# Patient Record
Sex: Female | Born: 1960 | Race: White | Hispanic: No | Marital: Married | Smoking: Current every day smoker
Health system: Southern US, Community
[De-identification: ages and names within clinical notes are randomized; demographics above are authoritative.]

## PROBLEM LIST (undated history)

## (undated) DIAGNOSIS — D649 Anemia, unspecified: Secondary | ICD-10-CM

## (undated) DIAGNOSIS — T7840XA Allergy, unspecified, initial encounter: Secondary | ICD-10-CM

## (undated) DIAGNOSIS — R809 Proteinuria, unspecified: Secondary | ICD-10-CM

## (undated) DIAGNOSIS — Z860100 Personal history of colon polyps, unspecified: Secondary | ICD-10-CM

## (undated) DIAGNOSIS — M810 Age-related osteoporosis without current pathological fracture: Secondary | ICD-10-CM

## (undated) DIAGNOSIS — I10 Essential (primary) hypertension: Secondary | ICD-10-CM

## (undated) DIAGNOSIS — F419 Anxiety disorder, unspecified: Secondary | ICD-10-CM

## (undated) HISTORY — DX: Essential (primary) hypertension: I10

## (undated) HISTORY — DX: Personal history of colon polyps, unspecified: Z86.0100

## (undated) HISTORY — PX: POLYPECTOMY: SHX149

## (undated) HISTORY — DX: Anxiety disorder, unspecified: F41.9

## (undated) HISTORY — DX: Age-related osteoporosis without current pathological fracture: M81.0

## (undated) HISTORY — DX: Proteinuria, unspecified: R80.9

## (undated) HISTORY — PX: COLONOSCOPY W/ POLYPECTOMY: SHX1380

## (undated) HISTORY — DX: Allergy, unspecified, initial encounter: T78.40XA

## (undated) HISTORY — PX: COLONOSCOPY: SHX174

## (undated) HISTORY — DX: Anemia, unspecified: D64.9

---

## 2000-11-04 ENCOUNTER — Inpatient Hospital Stay (HOSPITAL_COMMUNITY): Admission: EM | Admit: 2000-11-04 | Discharge: 2000-11-09 | Payer: Self-pay | Admitting: *Deleted

## 2000-11-10 ENCOUNTER — Other Ambulatory Visit (HOSPITAL_COMMUNITY): Admission: RE | Admit: 2000-11-10 | Discharge: 2000-11-10 | Payer: Self-pay | Admitting: Psychiatry

## 2003-12-25 ENCOUNTER — Ambulatory Visit: Payer: Self-pay | Admitting: Family Medicine

## 2004-02-18 ENCOUNTER — Ambulatory Visit: Payer: Self-pay | Admitting: Family Medicine

## 2004-03-18 ENCOUNTER — Ambulatory Visit: Payer: Self-pay | Admitting: Family Medicine

## 2004-05-07 ENCOUNTER — Ambulatory Visit: Payer: Self-pay | Admitting: Family Medicine

## 2004-06-08 ENCOUNTER — Ambulatory Visit: Payer: Self-pay | Admitting: Family Medicine

## 2004-08-11 ENCOUNTER — Ambulatory Visit: Payer: Self-pay | Admitting: Family Medicine

## 2004-10-06 ENCOUNTER — Ambulatory Visit: Payer: Self-pay | Admitting: Family Medicine

## 2004-12-17 ENCOUNTER — Ambulatory Visit: Payer: Self-pay | Admitting: Family Medicine

## 2005-04-05 ENCOUNTER — Ambulatory Visit: Payer: Self-pay | Admitting: Family Medicine

## 2006-03-15 ENCOUNTER — Ambulatory Visit: Payer: Self-pay | Admitting: Family Medicine

## 2006-04-01 ENCOUNTER — Ambulatory Visit: Payer: Self-pay | Admitting: Family Medicine

## 2007-06-09 ENCOUNTER — Ambulatory Visit (HOSPITAL_COMMUNITY): Admission: RE | Admit: 2007-06-09 | Discharge: 2007-06-09 | Payer: Self-pay | Admitting: Family Medicine

## 2009-01-30 ENCOUNTER — Encounter (INDEPENDENT_AMBULATORY_CARE_PROVIDER_SITE_OTHER): Payer: Self-pay | Admitting: *Deleted

## 2009-02-05 ENCOUNTER — Encounter (INDEPENDENT_AMBULATORY_CARE_PROVIDER_SITE_OTHER): Payer: Self-pay

## 2009-02-07 ENCOUNTER — Ambulatory Visit: Payer: Self-pay | Admitting: Gastroenterology

## 2009-03-03 ENCOUNTER — Ambulatory Visit: Payer: Self-pay | Admitting: Gastroenterology

## 2009-03-04 ENCOUNTER — Encounter: Payer: Self-pay | Admitting: Gastroenterology

## 2009-03-04 ENCOUNTER — Telehealth: Payer: Self-pay | Admitting: Gastroenterology

## 2009-05-10 IMAGING — CR DG CHEST 2V
2 series · 2 of 2 positions shown · non-contrast
Comparison: Outside film from [facility] 05/12/2007

CLINICAL DATA: Abnormal chest x-ray, density at left heart border

CHEST - 2 VIEW

[view not recorded (1 of 2)]
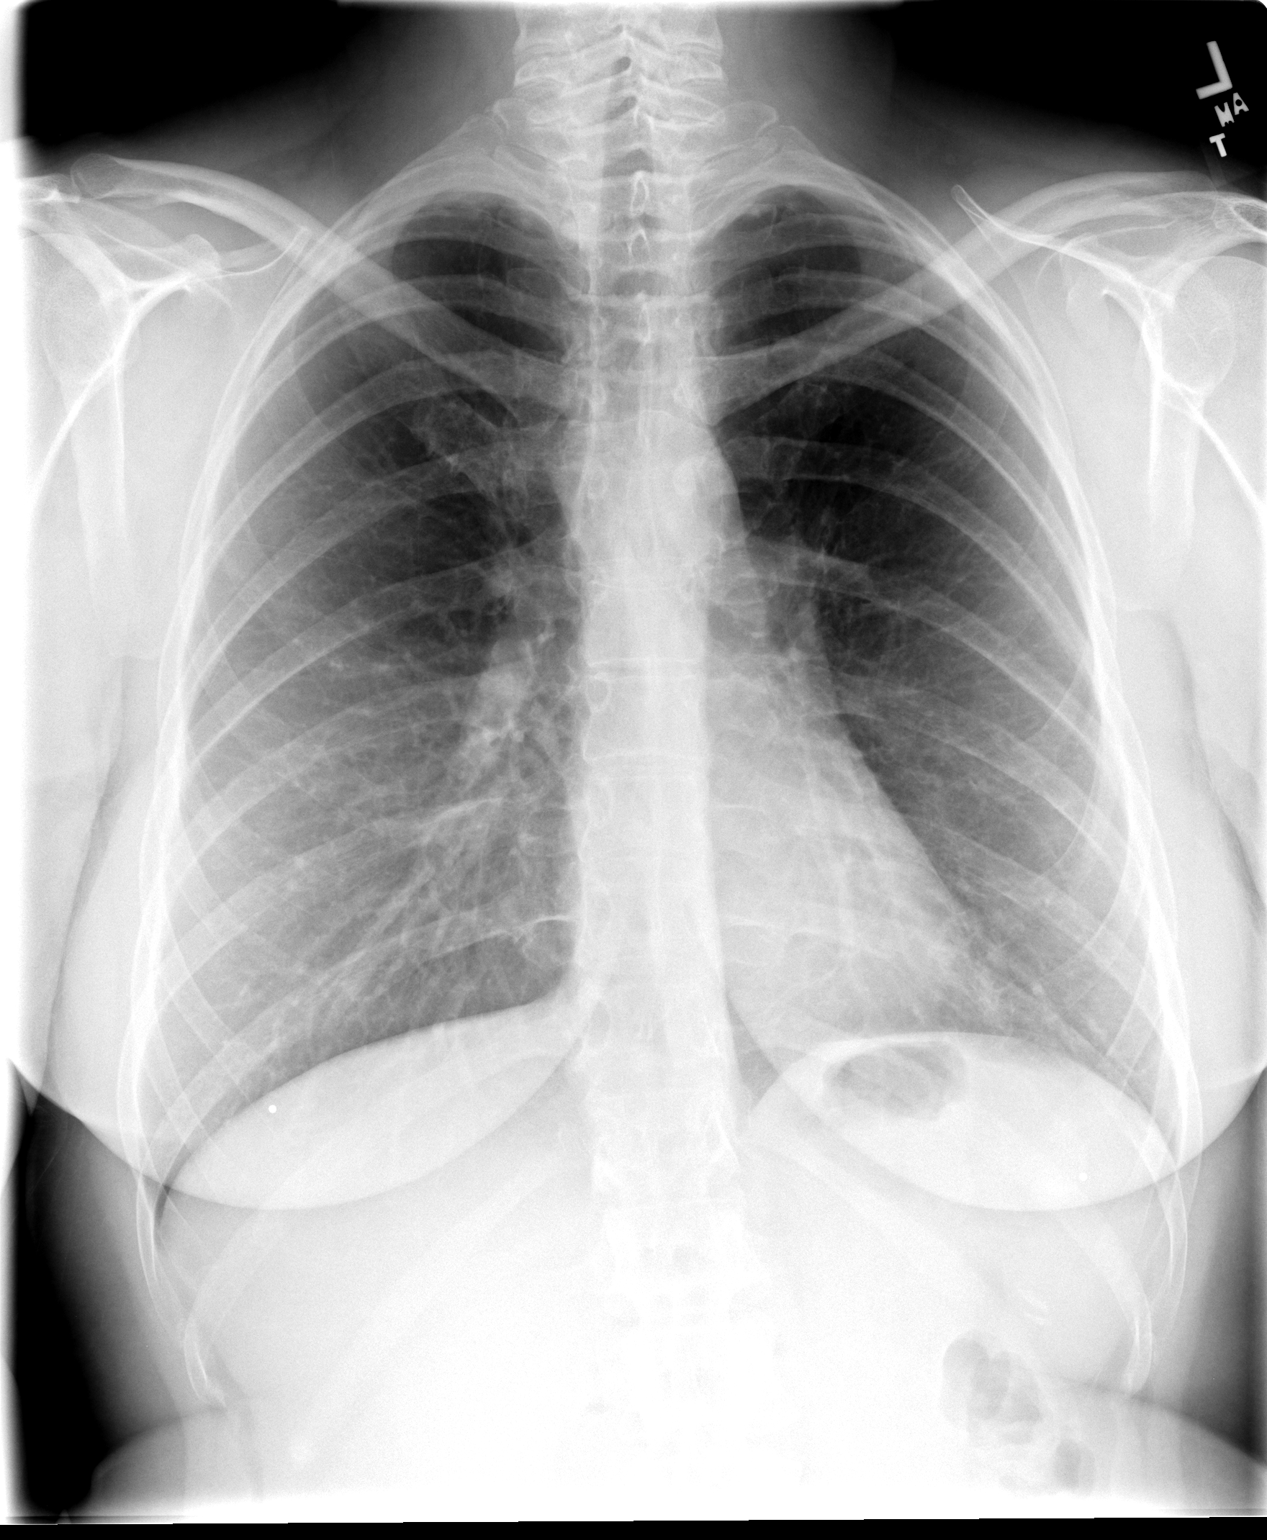

[view not recorded (2 of 2)]
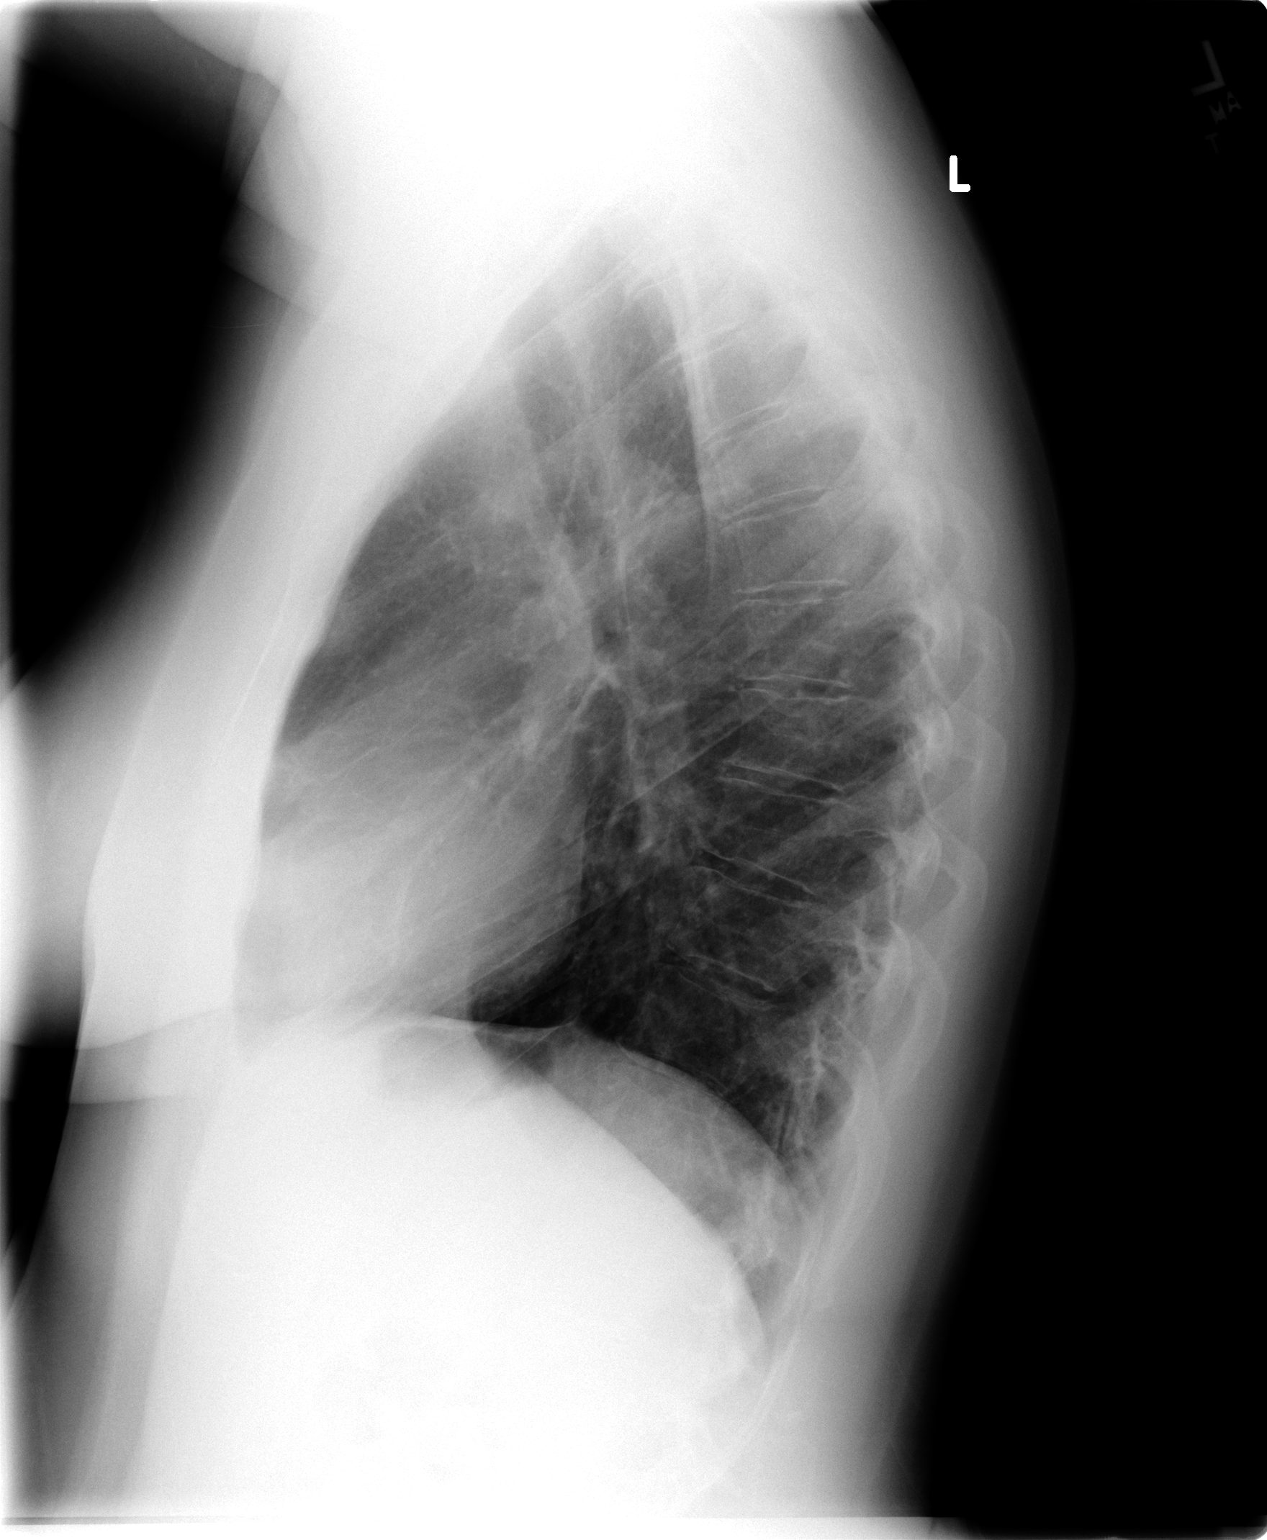

[2 of 2 positions shown; findings below may reference images not displayed]

FINDINGS: Normal heart size, mediastinal contours, and pulmonary vascularity.
Subtle density seen at left heart border on preceding exam no
longer identified, likely atelectasis which has improved.
Lungs clear.
No pleural effusion or pneumothorax.
Bones unremarkable.
IMPRESSION: No acute abnormalities.
Specifically, resolution of previously identified atelectasis
adjacent to left heart border.

## 2009-05-13 ENCOUNTER — Encounter (INDEPENDENT_AMBULATORY_CARE_PROVIDER_SITE_OTHER): Payer: Self-pay | Admitting: *Deleted

## 2009-06-10 ENCOUNTER — Ambulatory Visit: Payer: Self-pay | Admitting: Gastroenterology

## 2009-06-10 DIAGNOSIS — F329 Major depressive disorder, single episode, unspecified: Secondary | ICD-10-CM

## 2009-06-10 DIAGNOSIS — Z860100 Personal history of colon polyps, unspecified: Secondary | ICD-10-CM | POA: Insufficient documentation

## 2009-06-10 DIAGNOSIS — K589 Irritable bowel syndrome without diarrhea: Secondary | ICD-10-CM

## 2009-06-10 DIAGNOSIS — K5730 Diverticulosis of large intestine without perforation or abscess without bleeding: Secondary | ICD-10-CM | POA: Insufficient documentation

## 2009-06-10 LAB — CONVERTED CEMR LAB: Tissue Transglutaminase Ab, IgA: 3.3 units (ref <20)

## 2009-06-12 DIAGNOSIS — E538 Deficiency of other specified B group vitamins: Secondary | ICD-10-CM | POA: Insufficient documentation

## 2009-06-12 LAB — CONVERTED CEMR LAB
ALT: 15 units/L (ref 0–35)
BUN: 5 mg/dL — ABNORMAL LOW (ref 6–23)
Basophils Absolute: 0 10*3/uL (ref 0.0–0.1)
Basophils Relative: 0.6 % (ref 0.0–3.0)
Bilirubin, Direct: 0.1 mg/dL (ref 0.0–0.3)
Calcium: 9.2 mg/dL (ref 8.4–10.5)
Chloride: 106 meq/L (ref 96–112)
Creatinine, Ser: 0.8 mg/dL (ref 0.4–1.2)
Eosinophils Absolute: 0.4 10*3/uL (ref 0.0–0.7)
Ferritin: 15.7 ng/mL (ref 10.0–291.0)
HCT: 39.8 % (ref 36.0–46.0)
Hemoglobin: 13.9 g/dL (ref 12.0–15.0)
IgA: 109 mg/dL (ref 68–378)
Iron: 98 ug/dL (ref 42–145)
Lymphocytes Relative: 28.6 % (ref 12.0–46.0)
Lymphs Abs: 1.7 10*3/uL (ref 0.7–4.0)
MCHC: 35 g/dL (ref 30.0–36.0)
MCV: 98.1 fL (ref 78.0–100.0)
Monocytes Absolute: 0.4 10*3/uL (ref 0.1–1.0)
Neutro Abs: 3.6 10*3/uL (ref 1.4–7.7)
RBC: 4.05 M/uL (ref 3.87–5.11)
RDW: 14.2 % (ref 11.5–14.6)
TSH: 0.83 microintl units/mL (ref 0.35–5.50)
Total Bilirubin: 0.5 mg/dL (ref 0.3–1.2)
Total Protein: 7 g/dL (ref 6.0–8.3)
Vitamin B-12: 149 pg/mL — ABNORMAL LOW (ref 211–911)

## 2009-07-10 ENCOUNTER — Ambulatory Visit: Payer: Self-pay | Admitting: Gastroenterology

## 2009-08-12 ENCOUNTER — Ambulatory Visit: Payer: Self-pay | Admitting: Gastroenterology

## 2010-02-17 NOTE — Letter (Signed)
Summary: Previsit letter  [facility]  [address] [place], [place] [address]   Phone: [phone]  Fax: [phone]       01/30/2009 MRN: [id number]  [facility] [address] [place], [place]  [address]  Dear Valerie Arellano,  Welcome to the Gastroenterology Division at [facility].    You are scheduled to see a nurse for your pre-procedure visit on 02-07-09 at 9:00a.m. on the 3rd floor at [facility], [address]  We ask that you try to arrive at our office 15 minutes prior to your appointment time to allow for check-in.  Your nurse visit will consist of discussing your medical and surgical history, your immediate family medical history, and your medications.    Please bring a complete list of all your medications or, if you prefer, bring the medication bottles and we will list them.  We will need to be aware of both prescribed and over the counter drugs.  We will need to know exact dosage information as well.  If you are on blood thinners (Coumadin, Plavix, Aggrenox, Ticlid, etc.) please call our office today/prior to your appointment, as we need to consult with your physician about holding your medication.   Please be prepared to read and sign documents such as consent forms, a financial agreement, and acknowledgement forms.  If necessary, and with your consent, a friend or relative is welcome to sit-in on the nurse visit with you.  Please bring your insurance card so that we may make a copy of it.  If your insurance requires a referral to see a specialist, please bring your referral form from your primary care physician.  No co-pay is required for this nurse visit.     If you cannot keep your appointment, please call [phone] to cancel or reschedule prior to your appointment date.  This allows [place] the opportunity to schedule an appointment for another patient in need of care.    Thank you for choosing [facility] for your medical needs.  We  appreciate the opportunity to care for you.  Please visit [place] at our website  to learn more about our practice.                     Sincerely.                                                                                                                   The Gastroenterology Division

## 2010-02-17 NOTE — Letter (Signed)
Summary: Patient Notice- Polyp Results  [facility]  [address] [place], [place] [address]   Phone: [phone]  Fax: [phone]        March 04, 2009 MRN: [id number]    [facility] [address] [place], [place]  [address]    Dear Ms. Kobrin,  I am pleased to inform you that the colon polyp(s) removed during your recent colonoscopy was (were) found to be benign (no cancer detected) upon pathologic examination.  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  Should you develop new or worsening symptoms of abdominal pain, bowel habit changes or bleeding from the rectum or bowels, please schedule an evaluation with either your primary care physician or with me.  Additional information/recommendations:  _xx_ No further action with gastroenterology is needed at this time. Please      follow-up with your primary care physician for your other healthcare      needs.  __ Please call [phone] to schedule a return visit to review your      situation.  __ Please keep your follow-up visit as already scheduled.  __ Continue treatment plan as outlined the day of your exam.  Please call us if you are having persistent problems or have questions about your condition that have not been fully answered at this time.  Sincerely,  Mardella Layman MD [facility]  This letter has been electronically signed by your physician.  Appended Document: Patient Notice- Polyp Results Letter mailed 2.16.11

## 2010-02-17 NOTE — Miscellaneous (Signed)
Summary: Lec previsit  Clinical Lists Changes  Medications: Added new medication of MOVIPREP 100 GM  SOLR (PEG-KCL-NACL-NASULF-NA ASC-C) As per prep instructions. - Signed Rx of MOVIPREP 100 GM  SOLR (PEG-KCL-NACL-NASULF-NA ASC-C) As per prep instructions.;  #1 x 0;  Signed;  Entered by: Ulis Rias RN;  Authorized by: Mardella Layman MD [facility];  Method used: Electronically to [facility] [phone]*, [address], [place], [place], [place]  [address], Ph: [phone] or [phone], Fax: [phone] Allergies: Added new allergy or adverse reaction of * SULFUR Observations: Added new observation of NKA: F (02/07/2009 8:21)    Prescriptions: MOVIPREP 100 GM  SOLR (PEG-KCL-NACL-NASULF-NA ASC-C) As per prep instructions.  #1 x 0   Entered by:   Ulis Rias RN   Authorized by:   Mardella Layman MD [facility]   Signed by:   Ulis Rias RN on 02/07/2009   Method used:   Electronically to        [facility] [phone]* (retail)       [address]       [place], [place]  [address]       Ph: [phone] or [phone]       Fax: [phone]   RxID:   [id number]

## 2010-02-17 NOTE — Assessment & Plan Note (Signed)
Summary: 1 month follow up/slm   History of Present Illness Visit Type: Follow-up Visit Primary GI MD: Sheryn Bison MD FACP FAGA Primary Provider: Joette Catching, MD Requesting Provider: n/a Chief Complaint: 1 month follow up, Only had abdominal pain a couple of weeks ago from eating seeds that was on a bun. History of Present Illness:   Much improved but still with episodes of cramping, gas and bloating. Workup was unremarkable except for B12 deficiency. She was treated empirically with metronidazole 250 mg t.i.d. for 10 days along with probiotic therapy. Celiac serologies were borderline positive. She has noticed some sensitivity to gluten products in her diet.   GI Review of Systems      Denies abdominal pain, acid reflux, belching, bloating, chest pain, dysphagia with liquids, dysphagia with solids, heartburn, loss of appetite, nausea, vomiting, vomiting blood, weight loss, and  weight gain.        Denies anal fissure, black tarry stools, change in bowel habit, constipation, diarrhea, diverticulosis, fecal incontinence, heme positive stool, hemorrhoids, irritable bowel syndrome, jaundice, light color stool, liver problems, rectal bleeding, and  rectal pain.    Current Medications (verified): 1)  Zyprexa 10 Mg Tabs (Olanzapine) .... One Tablet By Mouth Once Daily 2)  Lorazepam 0.5 Mg Tabs (Lorazepam) .... One Tablet By Mouth Every Morning 3)  Aspir-Low 81 Mg Tbec (Aspirin) .... One Tablet By Mouth Once Daily 4)  Fish Oil Maximum Strength 1200 Mg Caps (Omega-3 Fatty Acids) .... 2 Tablets By Mouth Once Daily 5)  Advil Cold & Sinus Liqui-Gels 30-200 Mg Caps (Pseudoephedrine-Ibuprofen) .... As Needed 6)  Levsin/sl 0.125 Mg  Subl (Hyoscyamine Sulfate) .Marland Kitchen.. 1 Sl Q 4-6 Hrs As Needed 7)  Cyanocobalamin 1000 Mcg/ml Inj Soln (Cyanocobalamin) .Marland Kitchen.. 1 Cc Im Weekly X 3 8)  Nascobal 500 Mcg/0.49ml Soln (Cyanocobalamin) .Marland Kitchen.. 1 Spray Weekly 9)  Align   Caps (Misc Intestinal Flora Regulat) .... Take  One Capsule By Mouth Daily X 1 Mo  Allergies (verified): 1)  ! Sulfa  Past History:  Family History: Last updated: 06/10/2009 Family History of Colon Polyps:Mother, Aunt Family History of Heart Disease: Father  Social History: Last updated: 06/10/2009 Married Deputy Sherriff Patient currently smokes.  Alcohol Use - yes Daily Caffeine Use Illicit Drug Use - no  Past medical, surgical, family and social histories (including risk factors) reviewed for relevance to current acute and chronic problems.  Past Medical History: Reviewed history from 06/10/2009 and no changes required. 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 Bipolar disorder  Past Surgical History: Reviewed history from 06/10/2009 and no changes required. C-section  Family History: Reviewed history from 06/10/2009 and no changes required. Family History of Colon Polyps:Mother, Aunt Family History of Heart Disease: Father  Social History: Reviewed history from 06/10/2009 and no changes required. Married Arboriculturist Patient currently smokes.  Alcohol Use - yes Daily Caffeine Use Illicit Drug Use - no  Review of Systems  The patient denies anorexia, fever, weight loss, weight gain, vision loss, decreased hearing, hoarseness, chest pain, syncope, dyspnea on exertion, peripheral edema, prolonged cough, headaches, hemoptysis, abdominal pain, melena, hematochezia, severe indigestion/heartburn, hematuria, incontinence, genital sores, muscle weakness, suspicious skin lesions, transient blindness, difficulty walking, depression, unusual weight change, abnormal bleeding, enlarged lymph nodes, angioedema, breast masses, and testicular masses.    Vital Signs:  Patient profile:   50 year old female Height:      65 inches Weight:      175 pounds BMI:     29.23 BSA:  1.87 Pulse rate:   92 / minute Pulse rhythm:   regular BP sitting:   100 / 60  Vitals Entered By: Merri Ray CMA Duncan Dull) (July 10, 2009 8:25  AM)  Physical Exam  General:  Well developed, well nourished, no acute distress.healthy appearing.   Head:  Normocephalic and atraumatic. Eyes:  PERRLA, no icterus.exam deferred to patient's ophthalmologist.   Psych:  Alert and cooperative. Normal mood and affect.   Impression & Recommendations:  Problem # 1:  VITAMIN B12 DEFICIENCY (ICD-266.2) Assessment Improved Continue planned replacement therapy. She may well have gluten sensitivity and associated bacterial overgrowth syndrome. I have placed her on a gluten free diet for one month office followup at that time. She is to stop her probiotic therapy at this time.  Problem # 2:  DEPRESSION (ICD-311) Assessment: Improved  Problem # 3:  IBS (ICD-564.1) Assessment: Comment Only  Patient Instructions: 1)  Stop Align. 2)  Try a gluten free diet for one month. 3)  Please schedule a follow-up appointment in 1 month.  4)  The medication list was reviewed and reconciled.  All changed / newly prescribed medications were explained.  A complete medication list was provided to the patient / caregiver. 5)  Copy sent to : Dr. Joette Catching. 6)  Please continue current medications.  7)  Please schedule a follow-up appointment in 1 month.   Appended Document: 1 month follow up/slm    Clinical Lists Changes  Medications: Removed medication of ALIGN   CAPS (MISC INTESTINAL FLORA REGULAT) Take one capsule by mouth daily x 1 mo

## 2010-02-17 NOTE — Procedures (Signed)
Summary: Colonoscopy  Patient: [name] Note: All result statuses are Final unless otherwise noted.  Tests: (1) Colonoscopy (COL)   COL Colonoscopy           DONE     [facility]     [address]     [place], [place]  [address]           COLONOSCOPY PROCEDURE REPORT           PATIENT:  Valerie Arellano, Valerie Arellano  MR#:  [id number]     BIRTHDATE:  [birth year], 49 yrs. old  GENDER:  female           ENDOSCOPIST:  Vania Rea. Jarold Motto, MD, [facility]     Referred by:  Joette Catching, M.D.           PROCEDURE DATE:  03/03/2009     PROCEDURE:  Colonoscopy with snare polypectomy     ASA CLASS:  Class II     INDICATIONS:  Routine Risk Screening           MEDICATIONS:   Fentanyl 100 mcg IV, Versed 10 mg IV           DESCRIPTION OF PROCEDURE:   After the risks benefits and     alternatives of the procedure were thoroughly explained, informed     consent was obtained.  Digital rectal exam was performed and     revealed perianal skin tags.   The LB CF-[id number] endoscope     was introduced through the anus and advanced to the cecum, which     was identified by both the appendix and ileocecal valve, without     limitations.  The quality of the prep was excellent, using     MoviPrep.  The instrument was then slowly withdrawn as the colon     was fully examined.     <<PROCEDUREIMAGES>>           FINDINGS:  There were multiple polyps identified and removed. in     the right colon. FLAT UMBILICATED POLYPS SNARE EXCISED.4-5     MMMSIZE.CAUTERY APPLIED.  There were multiple polyps identified     and removed. in the sigmoid colon. FLAT 3-5 MM POLYP CAUTERIZED     WITH SNAR AND REMOVED.  Scattered diverticula were found sigmoid     to descending   Retroflexed views in the rectum revealed     hypertrophied anal papillae.    The scope was then withdrawn from     the patient and the procedure completed.           COMPLICATIONS:  None           ENDOSCOPIC IMPRESSION:     1) Polyps, multiple in  the right colon     2) Polyps, multiple in the sigmoid colon     3) Diverticula, scattered in the sigmoid to descending     4) Hypertrophied anal papillae     RECOMMENDATIONS:     1) Await biopsy results     2) Repeat Colonoscopy in 3 years.           REPEAT EXAM:  No           ______________________________     Vania Rea. Jarold Motto, MD, [facility]           CC:           n.     eSIGNED:   Vania Rea. Jonae Renshaw at 03/03/2009 09:17 AM  Reniya, Mcclees, [id number]  Note: An exclamation mark (!) indicates a result that was not dispersed into the flowsheet. Document Creation Date: 03/03/2009 9:17 AM _______________________________________________________________________  (1) Order result status: Final Collection or observation date-time: 03/03/2009 09:08 Requested date-time:  Receipt date-time:  Reported date-time:  Referring Physician:   Ordering Physician: Sheryn Bison [phone]) Specimen Source:  Source: Launa Grill Order Number: [id number] Lab site:   Appended Document: Colonoscopy     Procedures Next Due Date:    Colonoscopy: 02/2012  Appended Document: Colonoscopy     Procedures Next Due Date:    Colonoscopy: 02/2010

## 2010-02-17 NOTE — Letter (Signed)
Summary: [facility] Instructions  [facility]  [address] [place], [place] [address]   Phone: [phone]  Fax: [phone]       Valerie Arellano    1960/07/24    MRN: [id number]        Procedure Day Dorna Bloom:  Duanne Limerick 03/03/09     Arrival Time:  7:30AM     Procedure Time:  8:30AM     Location of Procedure:                    [facility]  [facility] (4th Floor)                        PREPARATION FOR COLONOSCOPY WITH MOVIPREP   Starting 5 days prior to your procedure 02/26/09 do not eat nuts, seeds, popcorn, corn, beans, peas,  salads, or any raw vegetables.  Do not take any fiber supplements (e.g. Metamucil, Citrucel, and Benefiber).  THE DAY BEFORE YOUR PROCEDURE         DATE: 03/02/09  DAY: SUNDAY  1.  Drink clear liquids the entire day-NO SOLID FOOD  2.  Do not drink anything colored red or purple.  Avoid juices with pulp.  No orange juice.  3.  Drink at least 64 oz. (8 glasses) of fluid/clear liquids during the day to prevent dehydration and help the prep work efficiently.  CLEAR LIQUIDS INCLUDE: Water Jello Ice Popsicles Tea (sugar ok, no milk/cream) Powdered fruit flavored drinks Coffee (sugar ok, no milk/cream) Gatorade Juice: apple, white grape, white cranberry  Lemonade Clear bullion, consomm, broth Carbonated beverages (any kind) Strained chicken noodle soup Hard Candy                             4.  In the morning, mix first dose of MoviPrep solution:    Empty 1 Pouch A and 1 Pouch B into the disposable container    Add lukewarm drinking water to the top line of the container. Mix to dissolve    Refrigerate (mixed solution should be used within 24 hrs)  5.  Begin drinking the prep at 5:00 p.m. The MoviPrep container is divided by 4 marks.   Every 15 minutes drink the solution down to the next mark (approximately 8 oz) until the full liter is complete.   6.  Follow completed prep with 16 oz of clear liquid of your choice (Nothing  red or purple).  Continue to drink clear liquids until bedtime.  7.  Before going to bed, mix second dose of MoviPrep solution:    Empty 1 Pouch A and 1 Pouch B into the disposable container    Add lukewarm drinking water to the top line of the container. Mix to dissolve    Refrigerate  THE DAY OF YOUR PROCEDURE      DATE: 03/03/09 DAY: MONDAY  Beginning at 3:30a.m. (5 hours before procedure):         1. Every 15 minutes, drink the solution down to the next mark (approx 8 oz) until the full liter is complete.  2. Follow completed prep with 16 oz. of clear liquid of your choice.    3. You may drink clear liquids until 6:30AM_ (2 HOURS BEFORE PROCEDURE).   MEDICATION INSTRUCTIONS  Unless otherwise instructed, you should take regular prescription medications with a small sip of water   as early as possible the morning of your  procedure.          OTHER INSTRUCTIONS  You will need a responsible adult at least 50 years of age to accompany you and drive you home.   This person must remain in the waiting room during your procedure.  Wear loose fitting clothing that is easily removed.  Leave jewelry and other valuables at home.  However, you may wish to bring a book to read or  an iPod/MP3 player to listen to music as you wait for your procedure to start.  Remove all body piercing jewelry and leave at home.  Total time from sign-in until discharge is approximately 2-3 hours.  You should go home directly after your procedure and rest.  You can resume normal activities the  day after your procedure.  The day of your procedure you should not:   Drive   Make legal decisions   Operate machinery   Drink alcohol   Return to work  You will receive specific instructions about eating, activities and medications before you leave.    The above instructions have been reviewed and explained to me by   Ulis Rias RN  February 07, 2009 9:25 AM     I fully understand and  can verbalize these instructions _____________________________ Date _________

## 2010-02-17 NOTE — Letter (Signed)
Summary: New Patient letter  [facility]  [address] [place], [place] [address]   Phone: [phone]  Fax: [phone]       05/13/2009 MRN: [id number]  [facility] [address] [place], [place]  [address]  Dear Ms. Landfair,  Welcome to the Gastroenterology Division at [facility].    You are scheduled to see Dr.  Jarold Motto on  06/10/2009 at  2:45pm  on the 3rd floor at [facility], [address].  We ask that you try to arrive at our office 15 minutes prior to your appointment time to allow for check-in.  We would like you to complete the enclosed self-administered evaluation form prior to your visit and bring it with you on the day of your appointment.  We will review it with you.  Also, please bring a complete list of all your medications or, if you prefer, bring the medication bottles and we will list them.  Please bring your insurance card so that we may make a copy of it.  If your insurance requires a referral to see a specialist, please bring your referral form from your primary care physician.  Co-payments are due at the time of your visit and may be paid by cash, check or credit card.     Your office visit will consist of a consult with your physician (includes a physical exam), any laboratory testing he/she may order, scheduling of any necessary diagnostic testing (e.g. x-ray, ultrasound, CT-scan), and scheduling of a procedure (e.g. Endoscopy, Colonoscopy) if required.  Please allow enough time on your schedule to allow for any/all of these possibilities.    If you cannot keep your appointment, please call [phone] to cancel or reschedule prior to your appointment date.  This allows [place] the opportunity to schedule an appointment for another patient in need of care.  If you do not cancel or reschedule by 5 p.m. the business day prior to your appointment date, you will be charged a $50.00 late cancellation/no-show fee.    Thank you for choosing  [facility] for your medical needs.  We appreciate the opportunity to care for you.  Please visit [place] at our website  to learn more about our practice.                     Sincerely,                                                             The Gastroenterology Division

## 2010-02-17 NOTE — Assessment & Plan Note (Signed)
Summary: lower abdominal pain since colonoscopy/lk   History of Present Illness Visit Type: Follow-up Visit Primary GI MD: Sheryn Bison MD FACP FAGA Primary Provider: Joette Catching, MD Chief Complaint: LLQ abd pains with cramping and bloating. Pt also states this relates to what she eats. She noticed after she eats hamburger but she has changed her diet some.  History of Present Illness:   Pleasant 50 year old Caucasian female who I did screening colonoscopy on earlier this year with removal of some serrated adenomas from her right colon. She's had no problems since her colonoscopy but gives a history of chronic IBS.  She's had years of intermittent diarrhea with crampy left lower quadrant pain but no rectal bleeding. She has chronic gas, bloating, has had no anorexia, weight loss, or systemic complaints. Her colonoscopy did show evidence of diverticulosis. It's of note that she does use large amount of sorbitol with diet chewing gum daily. She is otherwise no history of food intolerances but does use soy milk. She has no history of osteoporosis, anemia, recurrent skin rashes, arthropathy, mouth sores, fever, chills, or other systemic complaints. She also denies abuse of alcohol, cigarettes, or NSAIDs.  She denies upper gastrointestinal and tubular complaints. She is on daily aspirin tablet, Zyprexa, and lorazepam. Her mother suffers from irritable bowel syndrome and diverticulitis. There is no known family history of colon cancer.   GI Review of Systems    Reports abdominal pain and  bloating.     Location of  Abdominal pain: LLQ.    Denies acid reflux, belching, chest pain, dysphagia with liquids, dysphagia with solids, heartburn, loss of appetite, nausea, vomiting, vomiting blood, weight loss, and  weight gain.        Denies anal fissure, black tarry stools, change in bowel habit, constipation, diarrhea, diverticulosis, fecal incontinence, heme positive stool, hemorrhoids, irritable  bowel syndrome, jaundice, light color stool, liver problems, rectal bleeding, and  rectal pain. Preventive Screening-Counseling & Management  Alcohol-Tobacco     Smoking Status: current      Drug Use:  no.      Current Medications (verified): 1)  Zyprexa 10 Mg Tabs (Olanzapine) .... One Tablet By Mouth Once Daily 2)  Lorazepam 0.5 Mg Tabs (Lorazepam) .... One Tablet By Mouth Every Morning 3)  Aspir-Low 81 Mg Tbec (Aspirin) .... One Tablet By Mouth Once Daily 4)  Fish Oil Maximum Strength 1200 Mg Caps (Omega-3 Fatty Acids) .... 2 Tablets By Mouth Once Daily 5)  Advil Cold & Sinus Liqui-Gels 30-200 Mg Caps (Pseudoephedrine-Ibuprofen) .... As Needed  Allergies: 1)  ! Sulfa  Past History:  Past medical, surgical, family and social histories (including risk factors) reviewed for relevance to current acute and chronic problems.  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 Bipolar disorder  Past Surgical History: C-section  Family History: Reviewed history and no changes required. Family History of Colon Polyps:Mother, Aunt Family History of Heart Disease: Father  Social History: Reviewed history and no changes required. Married Arboriculturist Patient currently smokes.  Alcohol Use - yes Daily Caffeine Use Illicit Drug Use - no Smoking Status:  current Drug Use:  no  Review of Systems  The patient denies nausea, vomiting, hypoglycemia, palpitations, excessive diaphoresis, tremor, polyuria, erectile dysfunction, anxiety, fever, weight loss, weight gain, vision loss, hoarseness, chest pain, syncope, dyspnea on exertion, peripheral edema, prolonged cough, headaches, hemoptysis, abdominal pain, hematochezia, severe indigestion/heartburn, hematuria, incontinence, muscle weakness, suspicious skin lesions, depression, unusual weight change, angioedema, and breast masses.  Vital Signs:  Patient profile:   50 year old female Height:      65.5 inches Weight:       174.38 pounds BMI:     28.68 Pulse rate:   74 / minute Pulse rhythm:   regular BP sitting:   118 / 70  (left arm) Cuff size:   regular  Vitals Entered By: Christie Nottingham CMA Duncan Dull) (Jun 10, 2009 2:29 PM)  Physical Exam  General:  Well developed, well nourished, no acute distress.healthy appearing.   Head:  Normocephalic and atraumatic. Eyes:  PERRLA, no icterus.exam deferred to patient's ophthalmologist.   Neck:  Supple; no masses or thyromegaly. Lungs:  wheezes bilateral.   Heart:  Regular rate and rhythm; no murmurs, rubs,  or bruits. Abdomen:  Soft, nontender and nondistended. No masses, hepatosplenomegaly or hernias noted. Normal bowel sounds. Msk:  Symmetrical with no gross deformities. Normal posture. Pulses:  Normal pulses noted. Extremities:  No clubbing, cyanosis, edema or deformities noted. Neurologic:  Alert and  oriented x4;  grossly normal neurologically. Skin:  Intact without significant lesions or rashes. Cervical Nodes:  No significant cervical adenopathy. Psych:  Alert and cooperative. Normal mood and affect.   Impression & Recommendations:  Problem # 1:  IBS (ICD-564.1) Assessment Deteriorated Symptoms consistent with irritable bowel syndrome, diarrhea predominant. I suspect some of her complaints are related to malabsorption of nonabsorbable carbohydrates such as sorbitol and fructose,both of which have asked her to avoid. We'll check screening labs, place her on p.r.n. Levsin, and see her back in one month's time for followup. Celiac antibodies have been ordered. Orders: TLB-CBC Platelet - w/Differential (85025-CBCD) TLB-BMP (Basic Metabolic Panel-BMET) (80048-METABOL) TLB-Hepatic/Liver Function Pnl (80076-HEPATIC) TLB-TSH (Thyroid Stimulating Hormone) (84443-TSH) TLB-B12, Serum-Total ONLY (16109-U04) TLB-Ferritin (82728-FER) TLB-Folic Acid (Folate) (82746-FOL) TLB-IBC Pnl (Iron/FE;Transferrin) (83550-IBC) TLB-Sedimentation Rate (ESR) (85652-ESR) TLB-IgA  (Immunoglobulin A) (82784-IGA) T-Sprue Panel (Celiac Disease Aby Eval) (83516x3/[address])  Problem # 2:  PERSONAL HX COLONIC POLYPS (ICD-V12.72) Assessment: Unchanged Because of her young age and multiple right colon serrated adenomas, she should have followup colonoscopy in one years time.  Problem # 3:  DIVERTICULOSIS-COLON (ICD-562.10) Assessment: Unchanged Dietary adjustments as mentioned above. Malabsorption workup has been ordered.  Problem # 4:  DEPRESSION (ICD-311) Assessment: Improved continue Zyprexa as per Dr. Lysbeth Galas.  Patient Instructions: 1)  Begin Levsin as needed for abd pain. 2)  Please go to the basement for lab work. 3)  Information given on artificial sweeteners. 4)  Please schedule a follow-up appointment in 1 month.  5)  The medication list was reviewed and reconciled.  All changed / newly prescribed medications were explained.  A complete medication list was provided to the patient / caregiver. 6)  Copy sent to : Dr. Joette Catching 7)  Please continue current medications.  8)  Please schedule a follow-up appointment in 1 month.  9)  Excessive Gas Diet handout given.  Prescriptions: LEVSIN/SL 0.125 MG  SUBL (HYOSCYAMINE SULFATE) 1 SL q 4-6 hrs as needed  #40 x 3   Entered by:   Ashok Cordia RN   Authorized by:   Mardella Layman MD [facility]   Signed by:   Ashok Cordia RN on 06/10/2009   Method used:   Electronically to        [facility] [phone]* (retail)       [address]       [place], [place]  [address]  Ph: [phone] or [phone]       Fax: [phone]   RxID:   [id number]

## 2010-02-17 NOTE — Progress Notes (Signed)
Summary: Procedure yesterday  Phone Note Call from Patient Call back at Home Phone [phone]   Call For: Dr Jarold Motto Summary of Call: Had a Colon yesterday and this morning saw blood in her stools. Initial call taken by: Leanor Kail [facility],  March 04, 2009 9:11 AM  Follow-up for Phone Call        pt stated she had a little blood this am and this a smaller amt a couple of hours later-reassured-told looking for large amt of blood1/2 cup or large amt of clots-did have polyps removed-told to call back if gets worse or doesn't stop. Pt seemed reassured. Follow-up by: Oda Cogan,  March 04, 2009 9:35 AM

## 2010-02-17 NOTE — Assessment & Plan Note (Signed)
Summary: F/U APPT...LSW.   History of Present Illness Visit Type: Follow-up Visit Primary GI MD: Sheryn Bison MD FACP FAGA Primary Provider: Joette Catching, MD Requesting Provider: n/a Chief Complaint: follow-up Pt. states she is doing much better. History of Present Illness:   No GI complaints on gluten-free diet. She is receiving B12 replacement nasal spray and has completed parenteral therapy.   GI Review of Systems      Denies abdominal pain, acid reflux, belching, bloating, chest pain, dysphagia with liquids, dysphagia with solids, heartburn, loss of appetite, nausea, vomiting, vomiting blood, weight loss, and  weight gain.        Denies anal fissure, black tarry stools, change in bowel habit, constipation, diarrhea, diverticulosis, fecal incontinence, heme positive stool, hemorrhoids, irritable bowel syndrome, jaundice, light color stool, liver problems, rectal bleeding, and  rectal pain.    Current Medications (verified): 1)  Zyprexa 10 Mg Tabs (Olanzapine) .... One Tablet By Mouth Once Daily 2)  Lorazepam 0.5 Mg Tabs (Lorazepam) .... One Tablet By Mouth Every Morning 3)  Aspir-Low 81 Mg Tbec (Aspirin) .... One Tablet By Mouth Once Daily 4)  Fish Oil Maximum Strength 1200 Mg Caps (Omega-3 Fatty Acids) .... 2 Tablets By Mouth Once Daily 5)  Advil Cold & Sinus Liqui-Gels 30-200 Mg Caps (Pseudoephedrine-Ibuprofen) .... As Needed 6)  Levsin/sl 0.125 Mg  Subl (Hyoscyamine Sulfate) .Marland Kitchen.. 1 Sl Q 4-6 Hrs As Needed 7)  Nascobal 500 Mcg/0.13ml Soln (Cyanocobalamin) .Marland Kitchen.. 1 Spray Weekly  Allergies (verified): 1)  ! Sulfa  Past History:  Past medical, surgical, family and social histories (including risk factors) reviewed for relevance to current acute and chronic problems.  Past Medical History: Reviewed history from 06/10/2009 and no changes required. 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 Bipolar disorder  Past Surgical History: Reviewed history from 06/10/2009 and no changes  required. C-section  Family History: Reviewed history from 06/10/2009 and no changes required. Family History of Colon Polyps:Mother, Aunt Family History of Heart Disease: Father No FH of Colon Cancer:  Social History: Reviewed history from 06/10/2009 and no changes required. Married Arboriculturist Patient currently smokes.  Alcohol Use - yes Daily Caffeine Use Illicit Drug Use - no  Review of Systems       The patient complains of cough.  The patient denies allergy/sinus, anemia, anxiety-new, arthritis/joint pain, back pain, blood in urine, breast changes/lumps, change in vision, confusion, coughing up blood, depression-new, fainting, fatigue, fever, headaches-new, hearing problems, heart murmur, heart rhythm changes, itching, muscle pains/cramps, night sweats, nosebleeds, shortness of breath, skin rash, sleeping problems, sore throat, swelling of feet/legs, swollen lymph glands, thirst - excessive, urination - excessive, urination changes/pain, urine leakage, vision changes, and voice change.    Vital Signs:  Patient profile:   50 year old female Height:      65 inches Weight:      172.38 pounds BMI:     28.79 Pulse rate:   100 / minute Pulse rhythm:   regular BP sitting:   108 / 76  (left arm)  Vitals Entered By: Milford Cage NCMA (August 12, 2009 8:18 AM)  Physical Exam  General:  Well developed, well nourished, no acute distress. Head:  Normocephalic and atraumatic. Eyes:  PERRLA, no icterus.exam deferred to patient's ophthalmologist.   Psych:  Alert and cooperative. Normal mood and affect.   Impression & Recommendations:  Problem # 1:  IBS (ICD-564.1) Assessment Improved Obvious gluten sensitivity---all symptoms have improved with gluten restriction in her diet which I have asked her  to continue  Problem # 2:  VITAMIN B12 DEFICIENCY (ICD-266.2) Assessment: Improved Continue weekly nasal B12 spray  Patient Instructions: 1)  Please continue current  medications.  2)  Please schedule a follow-up appointment as needed.  3)  The medication list was reviewed and reconciled.  All changed / newly prescribed medications were explained.  A complete medication list was provided to the patient / caregiver. 4)  Copy sent to : Dr. Joette Catching. 5)  Please continue current medications.  6)  Please schedule a follow-up appointment as needed.  7)  Celiac Sprue brochure given.

## 2010-06-05 NOTE — H&P (Signed)
[facility]  Patient:    Valerie Arellano, Valerie Arellano Visit Number: [id number] MRN: [id number]          Service Type: EMS Location: [facility] Attending Physician:  Ilene Qua Dictated by:   Young Berry Scott, N.P. Admit Date:  11/04/2000 Discharge Date: 11/04/2000                     Psychiatric Admission Assessment  DATE OF ADMISSION:  November 04, 2000.  IDENTIFYING INFORMATION:  This is a 50 year old married Caucasian female for is a voluntary admission.  HISTORY OF THE PRESENT ILLNESS:  The patient is currently mute and her husband is not available for interview at this time.  Most of this assessment information is taken from the record and what was found in the emergency room yesterday.  This patient reported on October 18 to the [facility] Emergency Room, brought by her husband with a 5-day history of acute change in her behavior.  She presented acutely delusional, believing she was in heaven, laughing inappropriately one minute and crying the next, and at other times mute.  She was unresponsive to questions about person, place and time.  This morning, she is huddled under the covers, shaking, with some muscle twitching. She is completely mute and unresponsive to questioning.  Her eyes are darting around and she appears quite confused and appears to be responding to internal stimuli.  In the emergency room, her husband denied any recent head trauma, traffic accidents or any trauma that he was aware of.  He reported that she has been putting food in her mouth but had been unable to swallow.  Husband had taken her to the [facility] Emergency Room approximately 3 days ago.  She was given Valium and released.  The husband also reported that the patient had a similar episode approximately 3 years ago.  She was seen by a psychiatrist and it ultimately resolved on its own.  The patients husband also reported the patient was treated for  postpartum depression approximately 19 years ago, with on inpatient admission to [facility].  PAST PSYCHIATRIC HISTORY:  No other known admissions.  The patient does not have any mental health care currently.  SOCIAL HISTORY:  The patient is employed as a Emergency planning/management officer.  The patient is currently married to [employer] for the last 21 years.  Stable marriage, husband is supportive.  The patient has 2 children ages 49 and 43.  FAMILY HISTORY:  Negative for any mental illness or substance abuse.  ALCOHOL AND DRUG HISTORY:  The patient has been drinking about 2 to 4 beers a night for the past couple of weeks, with her last drink being October 14, according to her husband.  She has no history of withdrawal, DTs, blackouts, or seizures according to her husband.  MEDICATIONS:  There is some question if she was taking Paxil 12.5 mg daily. There was some Paxil in her possession.  Her husband is not available to corroborate her medication history, and she was given some Valium at the [facility] Emergency Room.  She also takes Nasocort and some other medications for seasonal allergies.  DRUG ALLERGIES:  SULFA.  POSITIVE PHYSICAL FINDINGS:  The patient was seen in the emergency room at [facility] and her physical exam at that time revealed some mild mid epigastric and left upper quadrant tenderness.  The patients lipase was within normal limits.  Her hemoglobin was mildly elevated at 15.6.  Her urine  drug screen was negative, and her urinalysis was within normal limits.  Her urine pregnancy test was negative.  Today, she displays no abdominal guarding, no apparent tenderness.  She is ambulating with encouragement and shows no signs of abdominal pain at this time.  On admission to the unit, her vital signs were temp 98.5, pulse 81, respirations 16, blood pressure 142/97.  MENTAL STATUS EXAMINATION:  This is a healthy-appearing young female with good hygiene, although  she is somewhat disheveled.  She is slightly tearful and is completely mute at this time, under the covers with eyes darting back and forth.  She swallows pills only with great encouragement.  We are unable to assess her cognition since she does not respond to any questions whatsoever and appears to be responsive to internal stimuli.  ADMISSION DIAGNOSES: Axis I:    1. Psychosis not otherwise specified.            2. Rule out bipolar disorder, currently manic. Axis II:   Deferred. Axis III:  Rule out dysphagia secondary to psychosis, anorexia secondary            to psychosis. Axis IV:   Deferred. Axis V:    Current 12, past year 72 estimated.  INITIAL PLAN OF CARE:  This is a voluntary admission.  Plan is to voluntarily admit the patient at this point.  She initially signed in voluntarily and we will consider an involuntary commitment on her since she seems unable to participate or consent for activities and we will consider forcing medications if we need to, simply because the patient has had such difficulty with swallowing because of her acute psychosis.  We have placed her on 1 to 1 observation at this time until further notice.  She did take some Zyprexa p.o. at h.s. and we had given her Zyprexa Zydis 10 mg now which she took with great difficulty, along with Ativan 1 mg p.o.   We will contact her husband for additional medication history and medical history and hope to speak with him today, and we will consider initiation of Geodon with some IM dosing because of her dysphagia.  ESTIMATED LENGTH OF STAY:  Seven to ten days. Dictated by:   Young Berry Scott, N.P. Attending Physician:  Ilene Qua DD:  11/05/00 TD:  11/07/00 Job: [id number]

## 2010-06-05 NOTE — Discharge Summary (Signed)
[facility]  Patient:    Valerie Arellano, Valerie Arellano Visit Number: [id number] MRN: [id number]          Service Type: PSY Location: [facility] Attending Physician:  Benjaman Pott Dictated by:   Jeanice Lim, M.D. Admit Date:  11/10/2000 Discharge Date: 11/10/2000                             Discharge Summary  IDENTIFYING DATA:  A 50 year old married Caucasian female for voluntary admission, almost mute at the time of admission, brought to the emergency room with a five-day history of acute change in behavior, acutely delusional, believing she was in heaven, laughing inappropriately one minute and crying the next, not fully cooperative.  MEDICATIONS: 1. Paxil 12.5 CR q.a.m. 2. Thallium which may have been given in the emergency room as well    as Nasacort.  DRUG ALLERGIES:  SULFA.  PHYSICAL EXAMINATION:  Essentially within normal limits.  ABDOMEN:  Slight epigastric and left upper quadrant tenderness.  Otherwise within normal limits.  NEUROLOGIC:  Nonfocal.  LABORATORIES:  Admission laboratories were within normal limits.  Hemoglobin slightly elevated.  Urine drug screen negative.  Urinalysis negative.  MENTAL STATUS EXAMINATION:  Healthy-appearing cooperative female somewhat disheveled, tearful, depressed affect, slightly labile.  Thought process somewhat tangential at times and speech is positive for paucity, almost mute at times, and thought content was difficult to evaluate, but no overt suicidal or homicidal or violent ideation.  ADMITTING DIAGNOSES: Axis I:    Psychosis, not otherwise specified, rule out bipolar disorder            currently manic. Axis II:   None. Axis III:  Rule out dysphasia secondary to psychosis, anorexia secondary            to psychosis. Axis IV:   Mild to moderate. Axis V:    20-30, and 70 in the last year estimated.  HOSPITAL COURSE:  The patient was admitted with routine p.r.n. medications, individual, group,  and milieu therapy, and stabilized on medications as indicated.  Zyprexa 5 mg q.h.s. was started and this was switched to Zydis to increase compliance and titrated as needed.  She required one-on-one initially due to the severity of psychosis and Haldol 5 mg p.r.n. for psychosis. Zyprexa/Zydis was optimized at 10 t.i.d., which she tolerated, and then slowly tapered.  A head CT with contrast was ordered to rule out a neurologic etiology of her psychosis.  Zyprexa was optimized and Depakote was started. The patient was given a nicotine patch to avoid nicotine withdrawal and family sessions showed a supportive family.  The patient showed positive response to medications with a decrease in mood lability, improvement in thought process.  Her condition on discharge was markedly improved and mood was less depressed, affect less labile.  Thought processes were more goal directed and thought content negative for referred psychotic symptoms.  No dangerous ideation. Judgment and insight partial and husband felt comfortable with discharge with supervision and intensive outpatient followup.  The patient was discharged to follow up with the Intensive Outpatient at [facility] on October 24.  DISCHARGE MEDICATIONS:  She was given prescriptions for: 1. Zyprexa 10 mg q.a.m. and q.h.s. 2. Ativan 0.5 t.i.d. 3. Depakote ER 500 q.h.s.  She was recommended not to return to work until medically cleared by her outpatient psychiatrist.  Her Global Assessment of Functioning on discharge was 55. Dictated by:   Jeanice Lim, M.D. Attending  Physician:  Carolanne Grumbling D DD:  12/07/00 TD:  12/09/00 Job: [id number]

## 2011-03-24 ENCOUNTER — Encounter: Payer: Self-pay | Admitting: Gastroenterology

## 2011-03-29 ENCOUNTER — Encounter: Payer: Self-pay | Admitting: Gastroenterology

## 2011-05-06 ENCOUNTER — Ambulatory Visit (AMBULATORY_SURGERY_CENTER): Payer: BC Managed Care – PPO | Admitting: *Deleted

## 2011-05-06 VITALS — Ht 65.0 in | Wt 170.0 lb

## 2011-05-06 DIAGNOSIS — Z86010 Personal history of colonic polyps: Secondary | ICD-10-CM

## 2011-05-06 DIAGNOSIS — Z1211 Encounter for screening for malignant neoplasm of colon: Secondary | ICD-10-CM

## 2011-05-06 MED ORDER — PEG-KCL-NACL-NASULF-NA ASC-C 100 G PO SOLR: ORAL | Status: DC

## 2011-05-17 ENCOUNTER — Ambulatory Visit (AMBULATORY_SURGERY_CENTER): Payer: BC Managed Care – PPO | Admitting: Gastroenterology

## 2011-05-17 ENCOUNTER — Encounter: Payer: Self-pay | Admitting: Gastroenterology

## 2011-05-17 VITALS — BP 135/75 | HR 67 | Temp 97.7°F | Resp 20 | Ht 65.0 in | Wt 170.0 lb

## 2011-05-17 DIAGNOSIS — D126 Benign neoplasm of colon, unspecified: Secondary | ICD-10-CM

## 2011-05-17 DIAGNOSIS — Z1211 Encounter for screening for malignant neoplasm of colon: Secondary | ICD-10-CM

## 2011-05-17 DIAGNOSIS — Z86010 Personal history of colonic polyps: Secondary | ICD-10-CM

## 2011-05-17 MED ORDER — SODIUM CHLORIDE 0.9 % IV SOLN: 500.0000 mL | INTRAVENOUS | Status: AC

## 2011-05-17 NOTE — Op Note (Addendum)
[facility] [address] [place], [place]  [address]  COLONOSCOPY PROCEDURE REPORT  PATIENT:  Valerie Arellano, Valerie Arellano  MR#:  [id number] BIRTHDATE:  1960-12-31, 51 yrs. old  GENDER:  female ENDOSCOPIST:  Vania Rea. Jarold Motto, MD, [facility] REF. BY: PROCEDURE DATE:  05/17/2011 PROCEDURE:  Colonoscopy with snare polypectomy ASA CLASS:  Class II INDICATIONS:  history of pre-cancerous (adenomatous) colon polyps RIGHT COLON SERRATED ADENOMAS IN FEB. 2011 MEDICATIONS:   propofol (Diprivan) 400 mg IV  DESCRIPTION OF PROCEDURE:   After the risks and benefits and of the procedure were explained, informed consent was obtained. Digital rectal exam was performed and revealed no abnormalities. The LB [id number]-[id number] endoscope was introduced through the anus and advanced to the cecum, which was identified by both the appendix and ileocecal valve.  The quality of the prep was excellent, using MoviPrep.  The instrument was then slowly withdrawn as the colon was fully examined. <<PROCEDUREIMAGES>>  FINDINGS:  ENDOSCOPIC FINDINGS:  A sessile polyp was found. 0 MM CECAL FLAT POLYP HOT SNARE REMOVED.SEE PICTIRS. A diminutive polyp was found in the distal transverse colon. 3 MM POLYP CAUTERIZED,NO TISSUE.TV COLON AREA.  This was otherwise a normal examination of the colon.  There were mild diverticular changes in left colon. diverticulosis was found.   Retroflexed views in the rectum revealed no abnormalities.    The scope was then withdrawn from the patient and the procedure completed.  COMPLICATIONS:  None ENDOSCOPIC IMPRESSION: 1) Sessile polyp 2) Diminutive polyp in the distal transverse colon 3) Otherwise normal examination 4) Diverticulosis RECURRENT SERRATED ADENOMAS. RECOMMENDATIONS: 1) Await pathology results 2) Repeat Colonoscopy in 3 years.  REPEAT EXAM:  No  ______________________________ Vania Rea. Jarold Motto, MD, [facility]  CC:  Joette Catching, MD  n. REVISED:  05/17/2011 10:06  AM eSIGNED:   Vania Rea. Amberley Hamler at 05/17/2011 10:06 AM  Myles Gip, [phone]

## 2011-05-17 NOTE — Progress Notes (Signed)
Patient did not experience any of the following events: a burn prior to discharge; a fall within the facility; wrong site/side/patient/procedure/implant event; or a hospital transfer or hospital admission upon discharge from the facility. (G8907) Patient did not have preoperative order for IV antibiotic SSI prophylaxis. (G8918) Patient did not have preoperative order for IV antibiotic SSI prophylaxis. (G8918)  

## 2011-05-17 NOTE — Patient Instructions (Addendum)
Impressions/Recommendations:  Polyps (handout given) Diverticulosis (handout given) High Fiber diet handout given  Repeat colonoscopy in 3 years.  Please resume all medications as you were taking them prior to your procedure.  YOU HAD AN ENDOSCOPIC PROCEDURE TODAY AT THE [facility]: Refer to the procedure report that was given to you for any specific questions about what was found during the examination.  If the procedure report does not answer your questions, please call your gastroenterologist to clarify.  If you requested that your care partner not be given the details of your procedure findings, then the procedure report has been included in a sealed envelope for you to review at your convenience later.  YOU SHOULD EXPECT: Some feelings of bloating in the abdomen. Passage of more gas than usual.  Walking can help get rid of the air that was put into your GI tract during the procedure and reduce the bloating. If you had a lower endoscopy (such as a colonoscopy or flexible sigmoidoscopy) you may notice spotting of blood in your stool or on the toilet paper. If you underwent a bowel prep for your procedure, then you may not have a normal bowel movement for a few days.  DIET: Your first meal following the procedure should be a light meal and then it is ok to progress to your normal diet.  A half-sandwich or bowl of soup is an example of a good first meal.  Heavy or fried foods are harder to digest and may make you feel nauseous or bloated.  Likewise meals heavy in dairy and vegetables can cause extra gas to form and this can also increase the bloating.  Drink plenty of fluids but you should avoid alcoholic beverages for 24 hours.  ACTIVITY: Your care partner should take you home directly after the procedure.  You should plan to take it easy, moving slowly for the rest of the day.  You can resume normal activity the day after the procedure however you should NOT DRIVE or use heavy  machinery for 24 hours (because of the sedation medicines used during the test).    SYMPTOMS TO REPORT IMMEDIATELY: A gastroenterologist can be reached at any hour.  During normal business hours, 8:30 AM to 5:00 PM Monday through Friday, call [phone].  After hours and on weekends, please call the GI answering service at [phone] who will take a message and have the physician on call contact you.   Following lower endoscopy (colonoscopy or flexible sigmoidoscopy):  Excessive amounts of blood in the stool  Significant tenderness or worsening of abdominal pains  Swelling of the abdomen that is new, acute  Fever of 100F or higher  Following upper endoscopy (EGD)  Vomiting of blood or coffee ground material  New chest pain or pain under the shoulder blades  Painful or persistently difficult swallowing  New shortness of breath  Fever of 100F or higher  Black, tarry-looking stools  FOLLOW UP: If any biopsies were taken you will be contacted by phone or by letter within the next 1-3 weeks.  Call your gastroenterologist if you have not heard about the biopsies in 3 weeks.  Our staff will call the home number listed on your records the next business day following your procedure to check on you and address any questions or concerns that you may have at that time regarding the information given to you following your procedure. This is a courtesy call and so if there is no answer at the home  number and we have not heard from you through the emergency physician on call, we will assume that you have returned to your regular daily activities without incident.  SIGNATURES/CONFIDENTIALITY: You and/or your care partner have signed paperwork which will be entered into your electronic medical record.  These signatures attest to the fact that that the information above on your After Visit Summary has been reviewed and is understood.  Full responsibility of the confidentiality of this discharge  information lies with you and/or your care-partner.

## 2011-05-18 ENCOUNTER — Telehealth: Payer: Self-pay | Admitting: *Deleted

## 2011-05-18 NOTE — Telephone Encounter (Signed)
No answer, left message to call if questions or concerns. 

## 2011-05-21 ENCOUNTER — Encounter: Payer: Self-pay | Admitting: Gastroenterology

## 2014-04-17 ENCOUNTER — Encounter: Payer: Self-pay | Admitting: Internal Medicine

## 2014-10-02 ENCOUNTER — Encounter: Payer: Self-pay | Admitting: Gastroenterology

## 2015-04-21 ENCOUNTER — Encounter: Payer: Self-pay | Admitting: Internal Medicine

## 2015-05-22 ENCOUNTER — Ambulatory Visit (AMBULATORY_SURGERY_CENTER): Payer: Self-pay

## 2015-05-22 VITALS — Ht 65.5 in | Wt 181.2 lb

## 2015-05-22 DIAGNOSIS — Z860100 Personal history of colon polyps, unspecified: Secondary | ICD-10-CM

## 2015-05-22 MED ORDER — SUPREP BOWEL PREP KIT 17.5-3.13-1.6 GM/177ML PO SOLN: 1.0000 | Freq: Once | ORAL | Status: DC

## 2015-05-22 NOTE — Progress Notes (Signed)
No allergies to eggs or soy No past problems with anesthesia No diet meds No home oxygen  Has email and internet; refused emmi

## 2015-05-26 ENCOUNTER — Encounter: Payer: Self-pay | Admitting: Internal Medicine

## 2015-06-02 ENCOUNTER — Telehealth: Payer: Self-pay | Admitting: Internal Medicine

## 2015-06-02 NOTE — Telephone Encounter (Signed)
No charge. 

## 2015-06-04 ENCOUNTER — Encounter: Payer: Self-pay | Admitting: Internal Medicine

## 2015-06-24 ENCOUNTER — Encounter: Payer: Self-pay | Admitting: Internal Medicine

## 2015-06-24 ENCOUNTER — Ambulatory Visit (AMBULATORY_SURGERY_CENTER): Payer: BLUE CROSS/BLUE SHIELD | Admitting: Internal Medicine

## 2015-06-24 VITALS — BP 133/83 | HR 68 | Temp 98.4°F | Resp 20 | Ht 65.5 in | Wt 181.0 lb

## 2015-06-24 DIAGNOSIS — D124 Benign neoplasm of descending colon: Secondary | ICD-10-CM

## 2015-06-24 DIAGNOSIS — Z86010 Personal history of colonic polyps: Secondary | ICD-10-CM

## 2015-06-24 DIAGNOSIS — K635 Polyp of colon: Secondary | ICD-10-CM

## 2015-06-24 MED ORDER — SODIUM CHLORIDE 0.9 % IV SOLN: 500.0000 mL | INTRAVENOUS | Status: DC

## 2015-06-24 NOTE — Progress Notes (Signed)
Called to room to assist during endoscopic procedure.  Patient ID and intended procedure confirmed with present staff. Received instructions for my participation in the procedure from the performing physician.  

## 2015-06-24 NOTE — Patient Instructions (Signed)
YOU HAD AN ENDOSCOPIC PROCEDURE TODAY AT THE [facility]:   Refer to the procedure report that was given to you for any specific questions about what was found during the examination.  If the procedure report does not answer your questions, please call your gastroenterologist to clarify.  If you requested that your care partner not be given the details of your procedure findings, then the procedure report has been included in a sealed envelope for you to review at your convenience later.  YOU SHOULD EXPECT: Some feelings of bloating in the abdomen. Passage of more gas than usual.  Walking can help get rid of the air that was put into your GI tract during the procedure and reduce the bloating. If you had a lower endoscopy (such as a colonoscopy or flexible sigmoidoscopy) you may notice spotting of blood in your stool or on the toilet paper. If you underwent a bowel prep for your procedure, you may not have a normal bowel movement for a few days.  Please Note:  You might notice some irritation and congestion in your nose or some drainage.  This is from the oxygen used during your procedure.  There is no need for concern and it should clear up in a day or so.  SYMPTOMS TO REPORT IMMEDIATELY:   Following lower endoscopy (colonoscopy or flexible sigmoidoscopy):  Excessive amounts of blood in the stool  Significant tenderness or worsening of abdominal pains  Swelling of the abdomen that is new, acute  Fever of 100F or higher    For urgent or emergent issues, a gastroenterologist can be reached at any hour by calling [phone].   DIET: Your first meal following the procedure should be a small meal and then it is ok to progress to your normal diet. Heavy or fried foods are harder to digest and may make you feel nauseous or bloated.  Likewise, meals heavy in dairy and vegetables can increase bloating.  Drink plenty of fluids but you should avoid alcoholic beverages for 24  hours.  ACTIVITY:  You should plan to take it easy for the rest of today and you should NOT DRIVE or use heavy machinery until tomorrow (because of the sedation medicines used during the test).    FOLLOW UP: Our staff will call the number listed on your records the next business day following your procedure to check on you and address any questions or concerns that you may have regarding the information given to you following your procedure. If we do not reach you, we will leave a message.  However, if you are feeling well and you are not experiencing any problems, there is no need to return our call.  We will assume that you have returned to your regular daily activities without incident.  If any biopsies were taken you will be contacted by phone or by letter within the next 1-3 weeks.  Please call us at [phone] if you have not heard about the biopsies in 3 weeks.    SIGNATURES/CONFIDENTIALITY: You and/or your care partner have signed paperwork which will be entered into your electronic medical record.  These signatures attest to the fact that that the information above on your After Visit Summary has been reviewed and is understood.  Full responsibility of the confidentiality of this discharge information lies with you and/or your care-partner.   Resume medications. Information given on polyps,diverticulosis and high fiber diet. 

## 2015-06-24 NOTE — Op Note (Signed)
[facility] Patient Name: Valerie Arellano Procedure Date: 06/24/2015 11:35 AM MRN: [id number] Endoscopist: Jerene Bears , MD Age: 55 Referring MD:  Date of Birth: 1960-04-23 Gender: Female Procedure:                Colonoscopy Indications:              High risk colon cancer surveillance: Personal                            history of sessile serrated colon polyp (less than                            10 mm in size) with no dysplasia, Last colonoscopy                            3 years ago Medicines:                Monitored Anesthesia Care Procedure:                Pre-Anesthesia Assessment:                           - Prior to the procedure, a History and Physical                            was performed, and patient medications and                            allergies were reviewed. The patient's tolerance of                            previous anesthesia was also reviewed. The risks                            and benefits of the procedure and the sedation                            options and risks were discussed with the patient.                            All questions were answered, and informed consent                            was obtained. Prior Anticoagulants: The patient has                            taken no previous anticoagulant or antiplatelet                            agents. ASA Grade Assessment: II - A patient with                            mild systemic disease. After reviewing the risks  and benefits, the patient was deemed in                            satisfactory condition to undergo the procedure.                           After obtaining informed consent, the colonoscope                            was passed under direct vision. Throughout the                            procedure, the patient's blood pressure, pulse, and                            oxygen saturations were monitored continuously. The     Model PCF-H190L [id number]) scope was introduced                            through the anus and advanced to the the cecum,                            identified by appendiceal orifice and ileocecal                            valve. The colonoscopy was performed without                            difficulty. The patient tolerated the procedure                            well. The quality of the bowel preparation was                            good. The ileocecal valve, appendiceal orifice, and                            rectum were photographed. Scope In: 11:38:51 AM Scope Out: [id number] AM Scope Withdrawal Time: 0 hours 14 minutes 31 seconds  Total Procedure Duration: 0 hours 17 minutes 55 seconds  Findings:                 The perianal exam findings include skin tags.                           A 6 mm polyp was found in the descending colon. The                            polyp was flat. The polyp was removed with a cold                            snare. Resection and retrieval were complete.  Multiple small and large-mouthed diverticula were                            found in the sigmoid colon and descending colon.                           The exam was otherwise without abnormality on                            direct and retroflexion views. Complications:            No immediate complications. Estimated Blood Loss:     Estimated blood loss: none. Impression:               - One 6 mm polyp in the descending colon, removed                            with a cold snare. Resected and retrieved.                           - Mild diverticulosis in the sigmoid colon and in                            the descending colon.                           - The examination was otherwise normal on direct                            and retroflexion views. Recommendation:           - Patient has a contact number available for                            emergencies. The signs  and symptoms of potential                            delayed complications were discussed with the                            patient. Return to normal activities tomorrow.                            Written discharge instructions were provided to the                            patient.                           - Resume previous diet.                           - Continue present medications.                           - Await pathology results.                           -  Repeat colonoscopy in 5 years for adenoma                            surveillance. Jerene Bears, MD 06/24/2015 12:02:16 PM This report has been signed electronically.

## 2015-06-24 NOTE — Progress Notes (Signed)
To Pacu  Pt awake and alert,  Report to RN

## 2015-06-25 ENCOUNTER — Telehealth: Payer: Self-pay

## 2015-06-25 NOTE — Telephone Encounter (Signed)
Attempt post procedure follow up call, no answer, left voice mail message.  

## 2015-07-01 ENCOUNTER — Encounter: Payer: Self-pay | Admitting: Internal Medicine

## 2021-03-09 ENCOUNTER — Encounter: Payer: Self-pay | Admitting: Internal Medicine

## 2021-04-15 ENCOUNTER — Other Ambulatory Visit (HOSPITAL_COMMUNITY): Payer: Self-pay | Admitting: Family Medicine

## 2021-04-15 DIAGNOSIS — Z13820 Encounter for screening for osteoporosis: Secondary | ICD-10-CM

## 2021-04-20 ENCOUNTER — Other Ambulatory Visit: Payer: Self-pay

## 2021-04-20 ENCOUNTER — Ambulatory Visit (AMBULATORY_SURGERY_CENTER): Payer: BLUE CROSS/BLUE SHIELD | Admitting: *Deleted

## 2021-04-20 VITALS — Ht 65.5 in | Wt 176.0 lb

## 2021-04-20 DIAGNOSIS — Z86010 Personal history of colonic polyps: Secondary | ICD-10-CM

## 2021-04-20 MED ORDER — NA SULFATE-K SULFATE-MG SULF 17.5-3.13-1.6 GM/177ML PO SOLN: 1.0000 | Freq: Once | ORAL | 0 refills | Status: AC

## 2021-04-20 NOTE — Progress Notes (Signed)

## 2021-04-22 ENCOUNTER — Encounter: Payer: Self-pay | Admitting: Internal Medicine

## 2021-04-28 ENCOUNTER — Ambulatory Visit (HOSPITAL_COMMUNITY)
Admission: RE | Admit: 2021-04-28 | Discharge: 2021-04-28 | Disposition: A | Discharge status: Home / Self Care | Payer: Commercial Managed Care - PPO | Source: Ambulatory Visit | Attending: Family Medicine | Admitting: Family Medicine

## 2021-04-28 DIAGNOSIS — Z13820 Encounter for screening for osteoporosis: Secondary | ICD-10-CM | POA: Diagnosis present

## 2021-04-30 ENCOUNTER — Encounter: Payer: Self-pay | Admitting: Internal Medicine

## 2021-05-04 ENCOUNTER — Ambulatory Visit (AMBULATORY_SURGERY_CENTER): Payer: Commercial Managed Care - PPO | Admitting: Internal Medicine

## 2021-05-04 ENCOUNTER — Encounter: Payer: Self-pay | Admitting: Internal Medicine

## 2021-05-04 VITALS — BP 128/63 | HR 70 | Temp 98.0°F | Resp 21 | Ht 65.0 in | Wt 176.0 lb

## 2021-05-04 DIAGNOSIS — D125 Benign neoplasm of sigmoid colon: Secondary | ICD-10-CM

## 2021-05-04 DIAGNOSIS — Z86010 Personal history of colonic polyps: Secondary | ICD-10-CM | POA: Diagnosis present

## 2021-05-04 DIAGNOSIS — K635 Polyp of colon: Secondary | ICD-10-CM

## 2021-05-04 DIAGNOSIS — D122 Benign neoplasm of ascending colon: Secondary | ICD-10-CM

## 2021-05-04 MED ORDER — SODIUM CHLORIDE 0.9 % IV SOLN: 500.0000 mL | Freq: Once | INTRAVENOUS | Status: DC

## 2021-05-04 NOTE — Progress Notes (Signed)
Called to room to assist during endoscopic procedure.  Patient ID and intended procedure confirmed with present staff. Received instructions for my participation in the procedure from the performing physician.  

## 2021-05-04 NOTE — Op Note (Signed)
[facility] ?Patient Name: Valerie Arellano ?Procedure Date: 05/04/2021 8:55 AM ?MRN: [id number] ?Endoscopist: Jerene Bears , MD ?Age: 61 ?Referring MD:  ?Date of Birth: 1960/09/06 ?Gender: Female ?Account #: [id number] ?Procedure:                Colonoscopy ?Indications:              High risk colon cancer surveillance: Personal  ?                          history of sessile serrated colon polyp (less than  ?                          10 mm in size) with no dysplasia (2013), Last  ?                          colonoscopy: June 2017 (hyperplastic polyp only) ?Medicines:                Monitored Anesthesia Care ?Procedure:                Pre-Anesthesia Assessment: ?                          - Prior to the procedure, a History and Physical  ?                          was performed, and patient medications and  ?                          allergies were reviewed. The patient's tolerance of  ?                          previous anesthesia was also reviewed. The risks  ?                          and benefits of the procedure and the sedation  ?                          options and risks were discussed with the patient.  ?                          All questions were answered, and informed consent  ?                          was obtained. Prior Anticoagulants: The patient has  ?                          taken no previous anticoagulant or antiplatelet  ?                          agents. ASA Grade Assessment: II - A patient with  ?                          mild systemic disease. After reviewing the risks  ?  and benefits, the patient was deemed in  ?                          satisfactory condition to undergo the procedure. ?                          After obtaining informed consent, the colonoscope  ?                          was passed under direct vision. Throughout the  ?                          procedure, the patient's blood pressure, pulse, and  ?                          oxygen saturations were  monitored continuously. The  ?                          PCF-HQ190L Colonoscope was introduced through the  ?                          anus and advanced to the cecum, identified by  ?                          appendiceal orifice and ileocecal valve. The  ?                          colonoscopy was performed without difficulty. The  ?                          patient tolerated the procedure well. The quality  ?                          of the bowel preparation was good. The ileocecal  ?                          valve, appendiceal orifice, and rectum were  ?                          photographed. ?Scope In: 9:04:12 AM ?Scope Out: 9:22:02 AM ?Scope Withdrawal Time: 0 hours 15 minutes 7 seconds  ?Total Procedure Duration: 0 hours 17 minutes 50 seconds  ?Findings:                 The digital rectal exam was normal. ?                          A 5 mm polyp was found in the ascending colon. The  ?                          polyp was sessile. The polyp was removed with a  ?                          cold snare. Resection and retrieval were complete. ?  A 5 mm polyp was found in the sigmoid colon. The  ?                          polyp was sessile. The polyp was removed with a  ?                          cold snare. Resection and retrieval were complete. ?                          Multiple small and large-mouthed diverticula were  ?                          found in the sigmoid colon and descending colon. ?                          Hypertrophied anal papillae with no additional  ?                          abnormalities were found on retroflexion. ?Complications:            No immediate complications. ?Estimated Blood Loss:     Estimated blood loss was minimal. ?Impression:               - One 5 mm polyp in the ascending colon, removed  ?                          with a cold snare. Resected and retrieved. ?                          - One 5 mm polyp in the sigmoid colon, removed with  ?                           a cold snare. Resected and retrieved. ?                          - Diverticulosis in the sigmoid colon and in the  ?                          descending colon. ?Recommendation:           - Patient has a contact number available for  ?                          emergencies. The signs and symptoms of potential  ?                          delayed complications were discussed with the  ?                          patient. Return to normal activities tomorrow.  ?                          Written discharge instructions were provided to the  ?  patient. ?                          - Resume previous diet. ?                          - Continue present medications. ?                          - Await pathology results. ?                          - Repeat colonoscopy is recommended for  ?                          surveillance. The colonoscopy date will be  ?                          determined after pathology results from today's  ?                          exam become available for review. ?Jerene Bears, MD ?05/04/2021 9:25:15 AM ?This report has been signed electronically. ?

## 2021-05-04 NOTE — Progress Notes (Signed)
? ?GASTROENTEROLOGY PROCEDURE H&P NOTE  ? ?Primary Care Physician: ?Dione Housekeeper, MD ? ? ? ?Reason for Procedure:  History of SSP/colon polyps ? ?Plan:    Surveillance colonoscopy ? ?Patient is appropriate for endoscopic procedure(s) in the ambulatory ([facility]) setting. ? ?The nature of the procedure, as well as the risks, benefits, and alternatives were carefully and thoroughly reviewed with the patient. Ample time for discussion and questions allowed. The patient understood, was satisfied, and agreed to proceed.  ? ? ? ?HPI: ?Valerie Arellano is a 62 y.o. female who presents for surveillance colonoscopy.  Medical history as below.  Tolerated the prep.  No recent chest pain or shortness of breath.  No abdominal pain today. ? ?Past Medical History:  ?Diagnosis Date  ? Allergy   ? seasonal  ? Anemia   ? 05/13/21  ? Anxiety   ? Hypertension   ? Osteoporosis   ? Protein in urine   ? 05/13/21  ? ? ?Past Surgical History:  ?Procedure Laterality Date  ? CESAREAN SECTION  01/19/1983  ? COLONOSCOPY    ? COLONOSCOPY W/ POLYPECTOMY    ? POLYPECTOMY    ? ? ?Prior to Admission medications   ?Medication Sig Start Date End Date Taking? Authorizing Provider  ?aspirin 81 MG tablet Take 81 mg by mouth daily.   Yes [provider]  ?atorvastatin (LIPITOR) 10 MG tablet Take 1 tablet by mouth daily. 02/24/18  Yes [provider]  ?cetirizine (ZYRTEC) 10 MG tablet Take 10 mg by mouth daily.   Yes [provider]  ?cholecalciferol (VITAMIN D3) 25 MCG (1000 UNIT) tablet  04/27/21  Yes [provider]  ?fish oil-omega-3 fatty acids 1000 MG capsule Take 1 g by mouth daily.   Yes [provider]  ?LORazepam (ATIVAN) 0.5 MG tablet Take 1 tablet by mouth Daily. 04/04/11  Yes [provider]  ?OLANZapine (ZYPREXA) 10 MG tablet Take 1 tablet by mouth at bedtime. 04/16/11  Yes [provider]  ?fluticasone (FLONASE) 50 MCG/ACT nasal spray Place into the nose.    [provider]   ?trimethoprim-polymyxin b (POLYTRIM) ophthalmic solution SMARTSIG:2 Drop(s) Left Eye ?Patient not taking: Reported on 05/04/2021 04/16/21   [provider]  ? ? ?Current Outpatient Medications  ?Medication Sig Dispense Refill  ? aspirin 81 MG tablet Take 81 mg by mouth daily.    ? atorvastatin (LIPITOR) 10 MG tablet Take 1 tablet by mouth daily.    ? cetirizine (ZYRTEC) 10 MG tablet Take 10 mg by mouth daily.    ? cholecalciferol (VITAMIN D3) 25 MCG (1000 UNIT) tablet     ? fish oil-omega-3 fatty acids 1000 MG capsule Take 1 g by mouth daily.    ? LORazepam (ATIVAN) 0.5 MG tablet Take 1 tablet by mouth Daily.    ? OLANZapine (ZYPREXA) 10 MG tablet Take 1 tablet by mouth at bedtime.    ? fluticasone (FLONASE) 50 MCG/ACT nasal spray Place into the nose.    ? trimethoprim-polymyxin b (POLYTRIM) ophthalmic solution SMARTSIG:2 Drop(s) Left Eye (Patient not taking: Reported on 05/04/2021)    ? ?Current Facility-Administered Medications  ?Medication Dose Route Frequency Provider Last Rate Last Admin  ? 0.9 %  sodium chloride infusion  500 mL Intravenous Continuous Sable Feil, MD      ? 0.9 %  sodium chloride infusion  500 mL Intravenous Once Epifanio Labrador, Lajuan Lines, MD      ? ? ?Allergies as of 05/04/2021 - Review Complete 05/04/2021  ?Allergen Reaction  Noted  ? Sulfonamide derivatives    ? Doxycycline Rash 05/06/2011  ? ? ?Family History  ?Problem Relation Age of Onset  ? Diverticulitis Mother   ? Heart disease Father   ? Melanoma Maternal Uncle   ? Irritable bowel syndrome Paternal Grandfather   ? Colon cancer Neg Hx   ? Esophageal cancer Neg Hx   ? Stomach cancer Neg Hx   ? Rectal cancer Neg Hx   ? Colon polyps Neg Hx   ? Crohn's disease Neg Hx   ? ? ?Social History  ? ?Socioeconomic History  ? Marital status: Married  ?  Spouse name: Not on file  ? Number of children: Not on file  ? Years of education: Not on file  ? Highest education level: Not on file  ?Occupational History  ? Not on file  ?Tobacco Use  ?  Smoking status: Some Days  ?  Passive exposure: Past  ? Smokeless tobacco: Never  ?Vaping Use  ? Vaping Use: Never used  ?Substance and Sexual Activity  ? Alcohol use: Yes  ?  Alcohol/week: 7.0 standard drinks  ?  Types: 7 Glasses of wine per week  ?  Comment: WINE NIGHTLTY  ? Drug use: No  ? Sexual activity: Not on file  ?Other Topics Concern  ? Not on file  ?Social History Narrative  ? Not on file  ? ?Social Determinants of Health  ? ?Financial Resource Strain: Not on file  ?Food Insecurity: Not on file  ?Transportation Needs: Not on file  ?Physical Activity: Not on file  ?Stress: Not on file  ?Social Connections: Not on file  ?Intimate Partner Violence: Not on file  ? ? ?Physical Exam: ?Vital signs in last 24 hours: ?'@BP'$  (!) 159/81   Pulse 74   Temp 98 ?F (36.7 ?C) (Skin)   Ht '5\' 5"'$  (1.651 m)   Wt 176 lb (79.8 kg)   LMP 05/06/2011   SpO2 99%   BMI 29.29 kg/m?  ?GEN: NAD ?EYE: Sclerae anicteric ?ENT: MMM ?CV: Non-tachycardic ?Pulm: CTA b/l ?GI: Soft, NT/ND ?NEURO:  Alert & Oriented x 3 ? ? ?Zenovia Jarred, MD ?[facility] ? ?05/04/2021 8:53 AM ? ?

## 2021-05-04 NOTE — Progress Notes (Signed)
To Pacu, VSS. Report to [email or web address] 

## 2021-05-04 NOTE — Patient Instructions (Signed)
Thank you for coming in to see [place] today! ?Await biopsy results, approximately 1-2 weeks.  Will make recommendation at that time for next surveillance colonoscopy. ?Resume previous diet and medications today, return to normal daily activities tomorrow. ? ?YOU HAD AN ENDOSCOPIC PROCEDURE TODAY AT [facility]:   Refer to the procedure report that was given to you for any specific questions about what was found during the examination.  If the procedure report does not answer your questions, please call your gastroenterologist to clarify.  If you requested that your care partner not be given the details of your procedure findings, then the procedure report has been included in a sealed envelope for you to review at your convenience later. ? ?YOU SHOULD EXPECT: Some feelings of bloating in the abdomen. Passage of more gas than usual.  Walking can help get rid of the air that was put into your GI tract during the procedure and reduce the bloating. If you had a lower endoscopy (such as a colonoscopy or flexible sigmoidoscopy) you may notice spotting of blood in your stool or on the toilet paper. If you underwent a bowel prep for your procedure, you may not have a normal bowel movement for a few days. ? ?Please Note:  You might notice some irritation and congestion in your nose or some drainage.  This is from the oxygen used during your procedure.  There is no need for concern and it should clear up in a day or so. ? ?SYMPTOMS TO REPORT IMMEDIATELY: ? ?Following lower endoscopy (colonoscopy or flexible sigmoidoscopy): ? Excessive amounts of blood in the stool ? Significant tenderness or worsening of abdominal pains ? Swelling of the abdomen that is new, acute ? Fever of 100?F or higher ? ? ? ?For urgent or emergent issues, a gastroenterologist can be reached at any hour by calling [phone]. ?Do not use MyChart messaging for urgent concerns.  ? ? ?DIET:  We do recommend a small meal at first, but then  you may proceed to your regular diet.  Drink plenty of fluids but you should avoid alcoholic beverages for 24 hours. ? ?ACTIVITY:  You should plan to take it easy for the rest of today and you should NOT DRIVE or use heavy machinery until tomorrow (because of the sedation medicines used during the test).   ? ?FOLLOW UP: ?Our staff will call the number listed on your records 48-72 hours following your procedure to check on you and address any questions or concerns that you may have regarding the information given to you following your procedure. If we do not reach you, we will leave a message.  We will attempt to reach you two times.  During this call, we will ask if you have developed any symptoms of COVID 19. If you develop any symptoms (ie: fever, flu-like symptoms, shortness of breath, cough etc.) before then, please call [phone].  If you test positive for Covid 19 in the 2 weeks post procedure, please call and report this information to [place].   ? ?If any biopsies were taken you will be contacted by phone or by letter within the next 1-3 weeks.  Please call us at [phone] if you have not heard about the biopsies in 3 weeks.  ? ? ?SIGNATURES/CONFIDENTIALITY: ?You and/or your care partner have signed paperwork which will be entered into your electronic medical record.  These signatures attest to the fact that that the information above on your After Visit Summary has  been reviewed and is understood.  Full responsibility of the confidentiality of this discharge information lies with you and/or your care-partner.  ?

## 2021-05-04 NOTE — Progress Notes (Signed)
Pt's states no medical or surgical changes since previsit or office visit. VS assessed by C.W 

## 2021-05-06 ENCOUNTER — Telehealth: Payer: Self-pay | Admitting: *Deleted

## 2021-05-06 NOTE — Telephone Encounter (Signed)
?  Follow up Call- ? ? ?  05/04/2021  ?  7:58 AM  ?Call back number  ?Post procedure Call Back phone  # [phone]  ?Permission to leave phone message Yes  ?  ? ?Patient questions: ? ?Do you have a fever, pain , or abdominal swelling? No. ?Pain Score  0 * ? ?Have you tolerated food without any problems? Yes.   ? ?Have you been able to return to your normal activities? Yes.   ? ?Do you have any questions about your discharge instructions: ?Diet   No. ?Medications  No. ?Follow up visit  No. ? ?Do you have questions or concerns about your Care? No. ? ?Actions: ?* If pain score is 4 or above: ?No action needed, pain <4. ? ? ?

## 2021-05-07 ENCOUNTER — Encounter: Payer: Self-pay | Admitting: Internal Medicine

## 2023-03-21 ENCOUNTER — Other Ambulatory Visit (HOSPITAL_COMMUNITY): Payer: Self-pay | Admitting: Nurse Practitioner

## 2023-03-21 DIAGNOSIS — R109 Unspecified abdominal pain: Secondary | ICD-10-CM

## 2023-03-23 ENCOUNTER — Ambulatory Visit (HOSPITAL_COMMUNITY)
Admission: RE | Admit: 2023-03-23 | Discharge: 2023-03-23 | Disposition: A | Discharge status: Home / Self Care | Source: Ambulatory Visit | Attending: Nurse Practitioner | Admitting: Nurse Practitioner

## 2023-03-23 DIAGNOSIS — R109 Unspecified abdominal pain: Secondary | ICD-10-CM | POA: Diagnosis present

## 2023-03-23 MED ORDER — IOHEXOL 300 MG/ML  SOLN
100.0000 mL | Freq: Once | INTRAMUSCULAR | Status: AC | PRN
  Administered 2023-03-23: 100 mL via INTRAVENOUS

## 2023-04-27 ENCOUNTER — Ambulatory Visit: Payer: Commercial Managed Care - PPO | Admitting: Physician Assistant

## 2023-05-09 ENCOUNTER — Other Ambulatory Visit (HOSPITAL_COMMUNITY): Payer: Self-pay | Admitting: Nurse Practitioner

## 2023-05-09 DIAGNOSIS — Z13820 Encounter for screening for osteoporosis: Secondary | ICD-10-CM

## 2023-05-09 DIAGNOSIS — Z8249 Family history of ischemic heart disease and other diseases of the circulatory system: Secondary | ICD-10-CM

## 2023-05-09 DIAGNOSIS — Z122 Encounter for screening for malignant neoplasm of respiratory organs: Secondary | ICD-10-CM

## 2023-05-17 ENCOUNTER — Ambulatory Visit (HOSPITAL_COMMUNITY)
Admission: RE | Admit: 2023-05-17 | Discharge: 2023-05-17 | Disposition: A | Discharge status: Home / Self Care | Source: Ambulatory Visit | Attending: Nurse Practitioner | Admitting: Nurse Practitioner

## 2023-05-17 DIAGNOSIS — Z13820 Encounter for screening for osteoporosis: Secondary | ICD-10-CM | POA: Diagnosis present

## 2023-05-18 ENCOUNTER — Ambulatory Visit (HOSPITAL_COMMUNITY)
Admission: RE | Admit: 2023-05-18 | Discharge: 2023-05-18 | Disposition: A | Discharge status: Home / Self Care | Source: Ambulatory Visit | Attending: Nurse Practitioner | Admitting: Nurse Practitioner

## 2023-05-18 DIAGNOSIS — Z8249 Family history of ischemic heart disease and other diseases of the circulatory system: Secondary | ICD-10-CM | POA: Diagnosis not present

## 2023-05-18 DIAGNOSIS — Z136 Encounter for screening for cardiovascular disorders: Secondary | ICD-10-CM | POA: Insufficient documentation

## 2023-05-24 ENCOUNTER — Ambulatory Visit (HOSPITAL_COMMUNITY)

## 2023-05-24 ENCOUNTER — Other Ambulatory Visit (HOSPITAL_COMMUNITY)

## 2023-05-31 ENCOUNTER — Ambulatory Visit (HOSPITAL_COMMUNITY)

## 2023-06-06 ENCOUNTER — Ambulatory Visit (HOSPITAL_COMMUNITY)

## 2024-01-27 ENCOUNTER — Ambulatory Visit: Admitting: Allergy & Immunology

## 2024-01-27 ENCOUNTER — Encounter: Payer: Self-pay | Admitting: Allergy & Immunology

## 2024-01-27 ENCOUNTER — Other Ambulatory Visit: Payer: Self-pay

## 2024-01-27 VITALS — BP 128/76 | HR 96 | Temp 97.9°F | Ht 62.99 in | Wt 165.6 lb

## 2024-01-27 DIAGNOSIS — J310 Chronic rhinitis: Secondary | ICD-10-CM | POA: Diagnosis not present

## 2024-01-27 DIAGNOSIS — R21 Rash and other nonspecific skin eruption: Secondary | ICD-10-CM

## 2024-01-27 MED ORDER — TACROLIMUS 0.1 % EX OINT: TOPICAL_OINTMENT | Freq: Two times a day (BID) | CUTANEOUS | 2 refills | Status: AC

## 2024-01-27 NOTE — Patient Instructions (Addendum)
 1. Chronic rhinitis - Because of insurance stipulations, we cannot do skin testing on the same day as your first visit. - We are all working to fight this, but for now we need to do two separate visits.  - We will know more after we do testing at the next visit.  - The skin testing visit can be squeezed in at your convenience.  - Then we can make a more full plan to address all of your symptoms. - Be sure to stop your antihistamines for 3 days before this appointment.   2. Rash - I am going to get some outside records from [facility] to see what they were thinking. - The testing will be enlightening for sure. - Continue with the tacrolimus  twice daily as needed.   3. Return in about 1 week (around 02/03/2024) for ALLERGY  TESTING (1-55 ENVIRO + 1-17 FULL FOODS). You can have the follow up appointment with Dr. Iva or a Nurse Practicioner (our Nurse Practitioners are excellent and always have Physician oversight!).    Please inform us  of any Emergency Department visits, hospitalizations, or changes in symptoms. Call us  before going to the ED for breathing or allergy  symptoms since we might be able to fit you in for a sick visit. Feel free to contact us  anytime with any questions, problems, or concerns.  It was a pleasure to meet you today!  Websites that have reliable patient information: 1. American Academy of Asthma, Allergy , and Immunology: www.aaaai.org 2. Food Allergy  Research and Education (FARE): foodallergy.org 3. Mothers of Asthmatics: http://www.asthmacommunitynetwork.org 4. American College of Allergy , Asthma, and Immunology: www.acaai.org      Like us  on [employer] and Instagram for our latest updates!      A healthy democracy works best when [employer] participate! Make sure you are registered to vote! If you have moved or changed any of your contact information, you will need to get this updated before voting! Scan the QR codes below to learn more!

## 2024-01-27 NOTE — Progress Notes (Unsigned)
 "  NEW PATIENT  Date of Service/Encounter:  01/27/2024  Consult requested by: Shona Norleen PEDLAR, MD   Assessment:   Chronic rhinitis  Rash - started in August 2025  Plan/Recommendations:   Patient Instructions  1. Chronic rhinitis - Because of insurance stipulations, we cannot do skin testing on the same day as your first visit. - We are all working to fight this, but for now we need to do two separate visits.  - We will know more after we do testing at the next visit.  - The skin testing visit can be squeezed in at your convenience.  - Then we can make a more full plan to address all of your symptoms. - Be sure to stop your antihistamines for 3 days before this appointment.   2. Rash - I am going to get some outside records from [facility] to see what they were thinking. - The testing will be enlightening for sure. - Continue with the tacrolimus  twice daily as needed.   3. Return in about 1 week (around 02/03/2024) for ALLERGY  TESTING (1-55 ENVIRO + 1-17 FULL FOODS). You can have the follow up appointment with Dr. Iva or a Nurse Practicioner (our Nurse Practitioners are excellent and always have Physician oversight!).    Please inform us  of any Emergency Department visits, hospitalizations, or changes in symptoms. Call us  before going to the ED for breathing or allergy  symptoms since we might be able to fit you in for a sick visit. Feel free to contact us  anytime with any questions, problems, or concerns.  It was a pleasure to meet you today!  Websites that have reliable patient information: 1. American Academy of Asthma, Allergy , and Immunology: www.aaaai.org 2. Food Allergy  Research and Education (FARE): foodallergy.org 3. Mothers of Asthmatics: http://www.asthmacommunitynetwork.org 4. [employer] of Allergy , Asthma, and Immunology: www.acaai.org      Like us  on [employer] and Instagram for our latest updates!      A healthy democracy works best when  [employer] participate! Make sure you are registered to vote! If you have moved or changed any of your contact information, you will need to get this updated before voting! Scan the QR codes below to learn more!              {Blank single:19197::This note in its entirety was forwarded to the Provider who requested this consultation.}  Subjective:   Valerie Arellano is a 64 y.o. female presenting today for evaluation of  Chief Complaint  Patient presents with   Seasonal allergy    Urticaria   Establish Care   Allergic Reaction    Unknow    Valerie Arellano has a history of the following: Patient Active Problem List   Diagnosis Date Noted   VITAMIN B12 DEFICIENCY 06/12/2009   DEPRESSION 06/10/2009   Diverticulosis of colon 06/10/2009   IBS 06/10/2009   History of colonic polyps 06/10/2009    History obtained from: chart review and {Persons; PED relatives w/patient:19415::patient}.  Discussed the use of AI scribe software for clinical note transcription with the patient and/or guardian, who gave verbal consent to proceed.  Valerie Arellano was referred by Shona Norleen PEDLAR, MD.     Kamil is a 64 y.o. female presenting for {Blank single:19197::a food challenge,a drug challenge,skin testing,a sick visit,an evaluation of ***,a follow up visit}.    Asthma/Respiratory Symptom History: ***  Allergic Rhinitis Symptom History: ***  Food Allergy  Symptom History: ***  Skin Symptom History: ***  GERD Symptom History: ***  Infection Symptom History: ***  ***Otherwise, there is no history of other atopic diseases, including {Blank multiple:19196:o:asthma,food allergies,drug allergies,environmental allergies,stinging insect allergies,eczema,urticaria,contact dermatitis}. There is no significant infectious history. ***Vaccinations are up to date.    Past Medical History: Patient Active Problem List   Diagnosis Date Noted   VITAMIN B12 DEFICIENCY  06/12/2009   DEPRESSION 06/10/2009   Diverticulosis of colon 06/10/2009   IBS 06/10/2009   History of colonic polyps 06/10/2009    Medication List:  Allergies as of 01/27/2024       Reactions   Sulfonamide Derivatives    REACTION: sick   Doxycycline Rash        Medication List        Accurate as of January 27, 2024  3:06 PM. If you have any questions, ask your nurse or doctor.          aspirin 81 MG tablet Take 81 mg by mouth daily.   atorvastatin 10 MG tablet Commonly known as: LIPITOR Take 1 tablet by mouth daily.   cetirizine 10 MG tablet Commonly known as: ZYRTEC Take 10 mg by mouth daily.   cholecalciferol 25 MCG (1000 UNIT) tablet Commonly known as: VITAMIN D3   fish oil-omega-3 fatty acids 1000 MG capsule Take 1 g by mouth daily.   fluticasone 50 MCG/ACT nasal spray Commonly known as: FLONASE Place into the nose.   LORazepam 0.5 MG tablet Commonly known as: ATIVAN Take 1 tablet by mouth Daily.   OLANZapine 10 MG tablet Commonly known as: ZYPREXA Take 1 tablet by mouth at bedtime.   tacrolimus  0.1 % ointment Commonly known as: PROTOPIC  Apply topically 2 (two) times daily.   trimethoprim-polymyxin b ophthalmic solution Commonly known as: POLYTRIM SMARTSIG:2 Drop(s) Left Eye        Birth History: non-contributory  Developmental History: non-contributory  Past Surgical History: Past Surgical History:  Procedure Laterality Date   CESAREAN SECTION  01/19/1983   COLONOSCOPY     COLONOSCOPY W/ POLYPECTOMY     POLYPECTOMY       Family History: Family History  Problem Relation Age of Onset   Diverticulitis Mother    Heart disease Father    Melanoma Maternal Uncle    Irritable bowel syndrome Paternal Grandfather    Colon cancer Neg Hx    Esophageal cancer Neg Hx    Stomach cancer Neg Hx    Rectal cancer Neg Hx    Colon polyps Neg Hx    Crohn's disease Neg Hx      Social History: Valerie Arellano lives at home with her family.  She was  in a house that is 65 years old.  There is hardwood throughout the home.  Electric heating and central cooling.  There is 1 dog and 2 cats inside her home.  There are no dust mite covers on the bedding.  There is tobacco exposure in the car.  She is a retired psychologist, occupational.  She has no fume, chemical, or dust exposure.  She smokes cigarettes, 1 pack/day for the past.    Review of systems otherwise negative other than that mentioned in the HPI.    Objective:   Blood pressure 128/76, pulse 96, temperature 97.9 F (36.6 C), temperature source Temporal, height 5' 2.99 (1.6 m), weight 165 lb 9.6 oz (75.1 kg), last menstrual period 05/06/2011, SpO2 96%. Body mass index is 29.34 kg/m.     Physical Exam   Diagnostic studies: none  Spirometry: {Blank single:19197::results normal (FEV1: ***%, FVC: ***%, FEV1/FVC: ***%),results  abnormal (FEV1: ***%, FVC: ***%, FEV1/FVC: ***%)}.    {Blank single:19197::Spirometry consistent with mild obstructive disease,Spirometry consistent with moderate obstructive disease,Spirometry consistent with severe obstructive disease,Spirometry consistent with possible restrictive disease,Spirometry consistent with mixed obstructive and restrictive disease,Spirometry uninterpretable due to technique,Spirometry consistent with normal pattern}. {Blank single:19197::Albuterol/Atrovent nebulizer,Xopenex/Atrovent nebulizer,Albuterol nebulizer,Albuterol four puffs via MDI,Xopenex four puffs via MDI} treatment given in clinic with {Blank single:19197::significant improvement in FEV1 per ATS criteria,significant improvement in FVC per ATS criteria,significant improvement in FEV1 and FVC per ATS criteria,improvement in FEV1, but not significant per ATS criteria,improvement in FVC, but not significant per ATS criteria,improvement in FEV1 and FVC, but not significant per ATS criteria,no improvement}.  Allergy  Studies: {Blank  single:19197::none,deferred due to recent antihistamine use,deferred due to insurance stipulations that require a separate visit for testing,labs sent instead, }    {Blank single:19197::Allergy  testing results were read and interpreted by myself, documented by clinical staff., }         Marty Shaggy, MD [facility]       "

## 2024-01-31 ENCOUNTER — Encounter: Payer: Self-pay | Admitting: Allergy & Immunology

## 2024-02-03 ENCOUNTER — Ambulatory Visit (INDEPENDENT_AMBULATORY_CARE_PROVIDER_SITE_OTHER): Admitting: Allergy & Immunology

## 2024-02-03 ENCOUNTER — Encounter: Payer: Self-pay | Admitting: Allergy & Immunology

## 2024-02-03 DIAGNOSIS — J3089 Other allergic rhinitis: Secondary | ICD-10-CM | POA: Diagnosis not present

## 2024-02-03 DIAGNOSIS — J302 Other seasonal allergic rhinitis: Secondary | ICD-10-CM | POA: Diagnosis not present

## 2024-02-03 MED ORDER — AZELASTINE HCL 0.1 % NA SOLN: 2.0000 | Freq: Two times a day (BID) | NASAL | 5 refills | Status: AC | PRN

## 2024-02-03 NOTE — Addendum Note (Signed)
 Addended by: AZALEA, Kendle Turbin on: 02/03/2024 05:22 PM   Modules accepted: Orders

## 2024-02-03 NOTE — Patient Instructions (Addendum)
 1. Chronic rhinitis - Testing today showed: grasses, trees, indoor molds, outdoor molds, dust mites, and dog - Copy of test results provided.  - Avoidance measures provided. - Continue with: Zyrtec (cetirizine) 10mg  tablet once daily and Flonase (fluticasone) one spray per nostril daily (AIM FOR EAR ON EACH SIDE) - Start taking: Astelin  (azelastine ) 2 sprays per nostril 1-2 times daily as needed - You can use an extra dose of the antihistamine, if needed, for breakthrough symptoms.  - Consider nasal saline rinses 1-2 times daily to remove allergens from the nasal cavities as well as help with mucous clearance (this is especially helpful to do before the nasal sprays are given) - STRONGLY CONSIDER allergy  shots as a means of long-term control. - Allergy  shots re-train and reset the immune system to ignore environmental allergens and decrease the resulting immune response to those allergens (sneezing, itchy watery eyes, runny nose, nasal congestion, etc).    - Allergy  shots improve symptoms in 75-85% of patients.  - We can discuss more at the next appointment if the medications are not working for you.  2. Rash - We are still waiting for the outside records.  - We will update you when they come in.  - Continue with the tacrolimus  twice daily as needed.   3. Return in about 3 months (around 05/03/2024). You can have the follow up appointment with Dr. Iva or a Nurse Practicioner (our Nurse Practitioners are excellent and always have Physician oversight!).    Please inform us  of any Emergency Department visits, hospitalizations, or changes in symptoms. Call us  before going to the ED for breathing or allergy  symptoms since we might be able to fit you in for a sick visit. Feel free to contact us  anytime with any questions, problems, or concerns.  It was a pleasure to see you again today!  Websites that have reliable patient information: 1. American Academy of Asthma, Allergy , and  Immunology: www.aaaai.org 2. Food Allergy  Research and Education (FARE): foodallergy.org 3. Mothers of Asthmatics: http://www.asthmacommunitynetwork.org 4. American College of Allergy , Asthma, and Immunology: www.acaai.org      Like us  on [employer] and Instagram for our latest updates!      A healthy democracy works best when [employer] participate! Make sure you are registered to vote! If you have moved or changed any of your contact information, you will need to get this updated before voting! Scan the QR codes below to learn more!      Airborne Adult Perc - 02/03/24 0904     Time Antigen Placed 9095    Allergen Manufacturer Jestine    Location Back    Number of Test 55    1. Control-Buffer 50% Glycerol Negative    2. Control-Histamine 2+    3. Bahia Negative    4. Bermuda Negative    5. Johnson Negative    6. [place]  Blue Negative    7. Meadow Fescue Negative    8. Perennial Rye Negative    9. Timothy Negative    10. Ragweed Mix Negative    11. Cocklebur Negative    12. Plantain,  English Negative    13. Baccharis Negative    14. Dog Fennel Negative    15. Russian Thistle Negative    16. Lamb's Quarters Negative    17. Sheep Sorrell Negative    18. Rough Pigweed Negative    19. Marsh Elder, Rough Negative    20. Mugwort, Common Negative    21. Box, Elder Negative  22. Cedar, red Negative    23. Sweet Gum Negative    24. Pecan Pollen Negative    25. Pine Mix Negative    26. Walnut, Black Pollen Negative    27. Red Mulberry Negative    28. Ash Mix Negative    29. Birch Mix Negative    30. Beech American Negative    31. Cottonwood, Eastern Negative    32. Hickory, White Negative    33. Maple Mix Negative    34. Oak, Eastern Mix Negative    35. Sycamore Eastern Negative    36. Alternaria Alternata Negative    37. Cladosporium Herbarum Negative    38. Aspergillus Mix Negative    39. Penicillium Mix Negative    40. Bipolaris Sorokiniana (Helminthosporium)  Negative    41. Drechslera Spicifera (Curvularia) Negative    42. Mucor Plumbeus Negative    43. Fusarium Moniliforme Negative    44. Aureobasidium Pullulans (pullulara) Negative    45. Rhizopus Oryzae Negative    46. Botrytis Cinera Negative    47. Epicoccum Nigrum Negative    48. Phoma Betae Negative    49. Dust Mite Mix Negative    50. Cat Hair 10,000 BAU/ml Negative    51.  Dog Epithelia 3+    52. Mixed Feathers Negative    53. Horse Epithelia Negative    54. Cockroach, German Negative    55. Tobacco Leaf Negative          Intradermal - 02/03/24 0940     Time Antigen Placed 0945    Allergen Manufacturer Jestine    Location Arm    Number of Test 15    Control Negative    Bahia Negative    Bermuda 3+    Johnson Negative    7 Grass Negative    Ragweed Mix Negative    Weed Mix Negative    Tree Mix 3+    Mold 1 2+    Mold 2 2+    Mold 3 Negative    Mold 4 3+    Mite Mix 4+    Cat Negative    Cockroach Negative          Food Adult Perc - 02/03/24 0900     Time Antigen Placed 9094    Allergen Manufacturer Jestine    Location Back    Number of allergen test 17    1. Peanut Negative    2. Soybean Negative    3. Wheat Negative    4. Sesame Negative    5. Milk, Cow Negative    6. Casein Negative    7. Egg White, Chicken Negative    8. Shellfish Mix Negative    9. Fish Mix Negative    10. Cashew Negative    11. Walnut Food Negative    12. Almond Negative    13. Hazelnut Negative    14. Pecan Food Negative    15. Pistachio Negative    16. Brazil Nut Negative    17. Coconut Negative    Comments n          Reducing Pollen Exposure  The American Academy of Allergy , Asthma and Immunology suggests the following steps to reduce your exposure to pollen during allergy  seasons.    Do not hang sheets or clothing out to dry; pollen may collect on these items. Do not mow lawns or spend time around freshly cut grass; mowing stirs up pollen. Keep windows closed at  night.  Keep  car windows closed while driving. Minimize morning activities outdoors, a time when pollen counts are usually at their highest. Stay indoors as much as possible when pollen counts or humidity is high and on windy days when pollen tends to remain in the air longer. Use air conditioning when possible.  Many air conditioners have filters that trap the pollen spores. Use a HEPA room air filter to remove pollen form the indoor air you breathe.  Control of Mold Allergen   Mold and fungi can grow on a variety of surfaces provided certain temperature and moisture conditions exist.  Outdoor molds grow on plants, decaying vegetation and soil.  The major outdoor mold, Alternaria and Cladosporium, are found in very high numbers during hot and dry conditions.  Generally, a late Summer - Fall peak is seen for common outdoor fungal spores.  Rain will temporarily lower outdoor mold spore count, but counts rise rapidly when the rainy period ends.  The most important indoor molds are Aspergillus and Penicillium.  Dark, humid and poorly ventilated basements are ideal sites for mold growth.  The next most common sites of mold growth are the bathroom and the kitchen.  Outdoor (Seasonal) Mold Control  Positive outdoor molds via skin testing: Alternaria and Cladosporium  Use air conditioning and keep windows closed Avoid exposure to decaying vegetation. Avoid leaf raking. Avoid grain handling. Consider wearing a face mask if working in moldy areas.    Indoor (Perennial) Mold Control   Positive indoor molds via skin testing: Aspergillus, Penicillium, Fusarium, Aureobasidium (Pullulara), and Rhizopus  Maintain humidity below 50%. Clean washable surfaces with 5% bleach solution. Remove sources e.g. contaminated carpets.    Control of Dust Mite Allergen    Dust mites play a major role in allergic asthma and rhinitis.  They occur in environments with high humidity wherever human skin is found.   Dust mites absorb humidity from the atmosphere (ie, they do not drink) and feed on organic matter (including shed human and animal skin).  Dust mites are a microscopic type of insect that you cannot see with the naked eye.  High levels of dust mites have been detected from mattresses, pillows, carpets, upholstered furniture, bed covers, clothes, soft toys and any woven material.  The principal allergen of the dust mite is found in its feces.  A gram of dust may contain 1,000 mites and 250,000 fecal particles.  Mite antigen is easily measured in the air during house cleaning activities.  Dust mites do not bite and do not cause harm to humans, other than by triggering allergies/asthma.    Ways to decrease your exposure to dust mites in your home:  Encase mattresses, box springs and pillows with a mite-impermeable barrier or cover   Wash sheets, blankets and drapes weekly in hot water (130 F) with detergent and dry them in a dryer on the hot setting.  Have the room cleaned frequently with a vacuum cleaner and a damp dust-mop.  For carpeting or rugs, vacuuming with a vacuum cleaner equipped with a high-efficiency particulate air (HEPA) filter.  The dust mite allergic individual should not be in a room which is being cleaned and should wait 1 hour after cleaning before going into the room. Do not sleep on upholstered furniture (eg, couches).   If possible removing carpeting, upholstered furniture and drapery from the home is ideal.  Horizontal blinds should be eliminated in the rooms where the person spends the most time (bedroom, study, television room).  Washable vinyl, roller-type  shades are optimal. Remove all non-washable stuffed toys from the bedroom.  Wash stuffed toys weekly like sheets and blankets above.   Reduce indoor humidity to less than 50%.  Inexpensive humidity monitors can be purchased at most hardware stores.  Do not use a humidifier as can make the problem worse and are not  recommended.  Control of Dog or Cat Allergen  Avoidance is the best way to manage a dog or cat allergy . If you have a dog or cat and are allergic to dog or cats, consider removing the dog or cat from the home. If you have a dog or cat but dont want to find it a new home, or if your family wants a pet even though someone in the household is allergic, here are some strategies that may help keep symptoms at bay:  Keep the pet out of your bedroom and restrict it to only a few rooms. Be advised that keeping the dog or cat in only one room will not limit the allergens to that room. Dont pet, hug or kiss the dog or cat; if you do, wash your hands with soap and water. High-efficiency particulate air (HEPA) cleaners run continuously in a bedroom or living room can reduce allergen levels over time. Regular use of a high-efficiency vacuum cleaner or a central vacuum can reduce allergen levels. Giving your dog or cat a bath at least once a week can reduce airborne allergen.  Allergy  Shots  Allergies are the result of a chain reaction that starts in the immune system. Your immune system controls how your body defends itself. For instance, if you have an allergy  to pollen, your immune system identifies pollen as an invader or allergen. Your immune system overreacts by producing antibodies called Immunoglobulin E (IgE). These antibodies travel to cells that release chemicals, causing an allergic reaction.  The concept behind allergy  immunotherapy, whether it is received in the form of shots or tablets, is that the immune system can be desensitized to specific allergens that trigger allergy  symptoms. Although it requires time and patience, the payback can be long-term relief. Allergy  injections contain a dilute solution of those substances that you are allergic to based upon your skin testing and allergy  history.   How Do Allergy  Shots Work?  Allergy  shots work much like a vaccine. Your body responds to  injected amounts of a particular allergen given in increasing doses, eventually developing a resistance and tolerance to it. Allergy  shots can lead to decreased, minimal or no allergy  symptoms.  There generally are two phases: build-up and maintenance. Build-up often ranges from three to six months and involves receiving injections with increasing amounts of the allergens. The shots are typically given once or twice a week, though more rapid build-up schedules are sometimes used.  The maintenance phase begins when the most effective dose is reached. This dose is different for each person, depending on how allergic you are and your response to the build-up injections. Once the maintenance dose is reached, there are longer periods between injections, typically two to four weeks.  Occasionally doctors give cortisone-type shots that can temporarily reduce allergy  symptoms. These types of shots are different and should not be confused with allergy  immunotherapy shots.  Who Can Be Treated with Allergy  Shots?  Allergy  shots may be a good treatment approach for people with allergic rhinitis (hay fever), allergic asthma, conjunctivitis (eye allergy ) or stinging insect allergy .   Before deciding to begin allergy  shots, you should consider:   The  length of allergy  season and the severity of your symptoms  Whether medications and/or changes to your environment can control your symptoms  Your desire to avoid long-term medication use  Time: allergy  immunotherapy requires a major time commitment  Cost: may vary depending on your insurance coverage  Allergy  shots for children age 7 and older are effective and often well tolerated. They might prevent the onset of new allergen sensitivities or the progression to asthma.  Allergy  shots are not started on patients who are pregnant but can be continued on patients who become pregnant while receiving them. In some patients with other medical conditions or who  take certain common medications, allergy  shots may be of risk. It is important to mention other medications you talk to your allergist.   What are the two types of build-ups offered:   RUSH or Rapid Desensitization -- one day of injections lasting from 8:30-4:30pm, injections every 1 hour.  Approximately half of the build-up process is completed in that one day.  The following week, normal build-up is resumed, and this entails ~16 visits either weekly or twice weekly, until reaching your maintenance dose which is continued weekly until eventually getting spaced out to every month for a duration of 3 to 5 years. The regular build-up appointments are nurse visits where the injections are administered, followed by required monitoring for 30 minutes.    Traditional build-up -- weekly visits for 6 -12 months until reaching maintenance dose, then continue weekly until eventually spacing out to every 4 weeks as above. At these appointments, the injections are administered, followed by required monitoring for 30 minutes.     Either way is acceptable, and both are equally effective. With the rush protocol, the advantage is that less time is spent here for injections overall AND you would also reach maintenance dosing faster (which is when the clinical benefit starts to become more apparent). Not everyone is a candidate for rapid desensitization.   IF we proceed with the RUSH protocol, there are premedications which must be taken the day before and the day after the rush only (this includes antihistamines, steroids, and Singulair).  After the rush day, no prednisone or Singulair is required, and we just recommend antihistamines taken on your injection day.  What Is An Estimate of the Costs?  If you are interested in starting allergy  injections, please check with your insurance company about your coverage for both allergy  vial sets and allergy  injections.  Please do so prior to making the appointment to  start injections.  The following are CPT codes to give to your insurance company. These are the amounts we BILL to the insurance company, but the amount YOU WILL PAY and WE RECEIVE IS SUBSTANTIALLY LESS and depends on the contracts we have with different insurance companies.   Amount Billed to Insurance One allergy  vial set  CPT 95165   $ 1200     Two allergy  vial set  CPT 95165   $ 2400     Three allergy  vial set  CPT 95165   $ 3600     One injection   CPT 95115   $ 35  Two injections   CPT 95117   $ 40 RUSH (Rapid Desensitization) CPT 95180 x 8 hours $500/hour  Regarding the allergy  injections, your co-pay may or may not apply with each injection, so please confirm this with your insurance company. When you start allergy  injections, 1 or 2 sets of vials are made based on your allergies.  Not all patients can be on one set of vials. A set of vials lasts 6 months to a year depending on how quickly you can proceed with your build-up of your allergy  injections. Vials are personalized for each patient depending on their specific allergens.  How often are allergy  injection given during the build-up period?   Injections are given at least weekly during the build-up period until your maintenance dose is achieved. Per the doctor's discretion, you may have the option of getting allergy  injections two times per week during the build-up period. However, there must be at least 48 hours between injections. The build-up period is usually completed within 6-12 months depending on your ability to schedule injections and for adjustments for reactions. When maintenance dose is reached, your injection schedule is gradually changed to every two weeks and later to every three weeks. Injections will then continue every 4 weeks. Usually, injections are continued for a total of 3-5 years.   When Will I Feel Better?  Some may experience decreased allergy  symptoms during the build-up phase. For others, it may take as long  as 12 months on the maintenance dose. If there is no improvement after a year of maintenance, your allergist will discuss other treatment options with you.  If you arent responding to allergy  shots, it may be because there is not enough dose of the allergen in your vaccine or there are missing allergens that were not identified during your allergy  testing. Other reasons could be that there are high levels of the allergen in your environment or major exposure to non-allergic triggers like tobacco smoke.  What Is the Length of Treatment?  Once the maintenance dose is reached, allergy  shots are generally continued for three to five years. The decision to stop should be discussed with your allergist at that time. Some people may experience a permanent reduction of allergy  symptoms. Others may relapse and a longer course of allergy  shots can be considered.  What Are the Possible Reactions?  The two types of adverse reactions that can occur with allergy  shots are local and systemic. Common local reactions include very mild redness and swelling at the injection site, which can happen immediately or several hours after. Report a delayed reaction from your last injection. These include arm swelling or runny nose, watery eyes or cough that occurs within 12-24 hours after injection. A systemic reaction, which is less common, affects the entire body or a particular body system. They are usually mild and typically respond quickly to medications. Signs include increased allergy  symptoms such as sneezing, a stuffy nose or hives.   Rarely, a serious systemic reaction called anaphylaxis can develop. Symptoms include swelling in the throat, wheezing, a feeling of tightness in the chest, nausea or dizziness. Most serious systemic reactions develop within 30 minutes of allergy  shots. This is why it is strongly recommended you wait in your doctors office for 30 minutes after your injections. Your allergist is trained to  watch for reactions, and his or her staff is trained and equipped with the proper medications to identify and treat them.   Report to the nurse immediately if you experience any of the following symptoms: swelling, itching or redness of the skin, hives, watery eyes/nose, breathing difficulty, excessive sneezing, coughing, stomach pain, diarrhea, or light headedness. These symptoms may occur within 15-20 minutes after injection and may require medication.   Who Should Administer Allergy  Shots?  The preferred location for receiving shots is your prescribing allergists office. Injections can  sometimes be given at another facility where the physician and staff are trained to recognize and treat reactions, and have received instructions by your prescribing allergist.  What if I am late for an injection?   Injection dose will be adjusted depending upon how many days or weeks you are late for your injection.   What if I am sick?   Please report any illness to the nurse before receiving injections. She may adjust your dose or postpone injections depending on your symptoms. If you have fever, flu, sinus infection or chest congestion it is best to postpone allergy  injections until you are better. Never get an allergy  injection if your asthma is causing you problems. If your symptoms persist, seek out medical care to get your health problem under control.  What If I am or Become Pregnant:  Women that become pregnant should schedule an appointment with [facility] before receiving any further allergy  injections.  Check out this information handout from the [employer] of Asthma, Allergy , and Immunology on allergy  shots!   [email or web address]

## 2024-02-03 NOTE — Progress Notes (Signed)
 "  FOLLOW UP  Date of Service/Encounter:  02/03/24   Assessment:   Seasonal and perennial allergic rhinitis (grasses, trees, indoor molds, outdoor molds, dust mites, and dog)   Rash - started in August 2025  Plan/Recommendations:   1. Chronic rhinitis - Testing today showed: grasses, trees, indoor molds, outdoor molds, dust mites, and dog - Copy of test results provided.  - Avoidance measures provided. - Continue with: Zyrtec (cetirizine) 10mg  tablet once daily and Flonase (fluticasone) one spray per nostril daily (AIM FOR EAR ON EACH SIDE) - Start taking: Astelin  (azelastine ) 2 sprays per nostril 1-2 times daily as needed - You can use an extra dose of the antihistamine, if needed, for breakthrough symptoms.  - Consider nasal saline rinses 1-2 times daily to remove allergens from the nasal cavities as well as help with mucous clearance (this is especially helpful to do before the nasal sprays are given) - STRONGLY CONSIDER allergy  shots as a means of long-term control. - Allergy  shots re-train and reset the immune system to ignore environmental allergens and decrease the resulting immune response to those allergens (sneezing, itchy watery eyes, runny nose, nasal congestion, etc).    - Allergy  shots improve symptoms in 75-85% of patients.  - We can discuss more at the next appointment if the medications are not working for you.  2. Rash - We are still waiting for the outside records.  - We will update you when they come in.  - Continue with the tacrolimus  twice daily as needed.   3. Return in about 3 months (around 05/03/2024). You can have the follow up appointment with Dr. Iva or a Nurse Practicioner (our Nurse Practitioners are excellent and always have Physician oversight!).   Subjective:   Valerie Arellano is a 64 y.o. female presenting today for follow up of No chief complaint on file.   Valerie Arellano has a history of the following: Patient Active Problem List    Diagnosis Date Noted   VITAMIN B12 DEFICIENCY 06/12/2009   DEPRESSION 06/10/2009   Diverticulosis of colon 06/10/2009   IBS 06/10/2009   History of colonic polyps 06/10/2009    History obtained from: chart review and patient.  Discussed the use of AI scribe software for clinical note transcription with the patient and/or guardian, who gave verbal consent to proceed.  Valerie Arellano is a 64 y.o. female presenting for skin testing. She was last seen on January 9th. We could not do testing because her insurance company does not cover testing on the same day as a New Patient visit. She has been off of all antihistamines 3 days in anticipation of the testing.   She was last seen in January 2026.  At that time, we decided to do environmental allergy  testing.  We attempted to get outside records from peds dermatology regarding her rash.  She continued with tacrolimus  twice daily.  She is undergoing allergy  testing today, having been instructed to refrain from taking antihistamines such as Benadryl and Zyrtec prior to the testing.  She owns two cats and a dog. She has not experienced a rash since her last visit a week ago.  Otherwise, there have been no changes to her past medical history, surgical history, family history, or social history.    Review of systems otherwise negative other than that mentioned in the HPI.    Objective:   Last menstrual period 05/06/2011. There is no height or weight on file to calculate BMI.    Physical exam deferred since this  was a skin testing appointment only.   Diagnostic studies:   Allergy  Studies:     Airborne Adult Perc - 02/03/24 0904     Time Antigen Placed 9095    Allergen Manufacturer Jestine    Location Back    Number of Test 55    1. Control-Buffer 50% Glycerol Negative    2. Control-Histamine 2+    3. Bahia Negative    4. Bermuda Negative    5. Johnson Negative    6. Kentucky  Blue Negative    7. Meadow Fescue Negative    8. Perennial Rye  Negative    9. Timothy Negative    10. Ragweed Mix Negative    11. Cocklebur Negative    12. Plantain,  English Negative    13. Baccharis Negative    14. Dog Fennel Negative    15. Russian Thistle Negative    16. Lamb's Quarters Negative    17. Sheep Sorrell Negative    18. Rough Pigweed Negative    19. Marsh Elder, Rough Negative    20. Mugwort, Common Negative    21. Box, Elder Negative    22. Cedar, red Negative    23. Sweet Gum Negative    24. Pecan Pollen Negative    25. Pine Mix Negative    26. Walnut, Black Pollen Negative    27. Red Mulberry Negative    28. Ash Mix Negative    29. Birch Mix Negative    30. Beech American Negative    31. Cottonwood, Eastern Negative    32. Hickory, White Negative    33. Maple Mix Negative    34. Oak, Eastern Mix Negative    35. Sycamore Eastern Negative    36. Alternaria Alternata Negative    37. Cladosporium Herbarum Negative    38. Aspergillus Mix Negative    39. Penicillium Mix Negative    40. Bipolaris Sorokiniana (Helminthosporium) Negative    41. Drechslera Spicifera (Curvularia) Negative    42. Mucor Plumbeus Negative    43. Fusarium Moniliforme Negative    44. Aureobasidium Pullulans (pullulara) Negative    45. Rhizopus Oryzae Negative    46. Botrytis Cinera Negative    47. Epicoccum Nigrum Negative    48. Phoma Betae Negative    49. Dust Mite Mix Negative    50. Cat Hair 10,000 BAU/ml Negative    51.  Dog Epithelia 3+    52. Mixed Feathers Negative    53. Horse Epithelia Negative    54. Cockroach, German Negative    55. Tobacco Leaf Negative          Intradermal - 02/03/24 0940     Time Antigen Placed 0945    Allergen Manufacturer Jestine    Location Arm    Number of Test 15    Control Negative    Bahia Negative    Bermuda 3+    Johnson Negative    7 Grass Negative    Ragweed Mix Negative    Weed Mix Negative    Tree Mix 3+    Mold 1 2+    Mold 2 2+    Mold 3 Negative    Mold 4 3+    Mite Mix 4+     Cat Negative    Cockroach Negative          Food Adult Perc - 02/03/24 0900     Time Antigen Placed 9094    Allergen Manufacturer Jestine    Location Back  Number of allergen test 17    1. Peanut Negative    2. Soybean Negative    3. Wheat Negative    4. Sesame Negative    5. Milk, Cow Negative    6. Casein Negative    7. Egg White, Chicken Negative    8. Shellfish Mix Negative    9. Fish Mix Negative    10. Cashew Negative    11. Walnut Food Negative    12. Almond Negative    13. Hazelnut Negative    14. Pecan Food Negative    15. Pistachio Negative    16. Brazil Nut Negative    17. Coconut Negative    Comments n          Allergy  testing results were read and interpreted by myself, documented by clinical staff.      Marty Shaggy, MD  [facility]        "

## 2024-02-24 NOTE — Progress Notes (Signed)
 We received records from her dermatologist [facility]).  It looks like she was seen last in November 2025.  They prescribed it as irritant contact dermatitis.  This is when she was referred to the allergist by the PA named Kaitlin Phelps.  She was doing better with the Zyrtec twice daily and tacrolimus .  She was referred to us  for patch testing.  Marty Shaggy, MD [facility] 

## 2024-05-04 ENCOUNTER — Ambulatory Visit: Admitting: Family Medicine
